# Patient Record
Sex: Female | Born: 1990 | Hispanic: Yes | Marital: Married | Smoking: Never smoker
Health system: Southern US, Community
[De-identification: ages and names within clinical notes are randomized; demographics above are authoritative.]

## PROBLEM LIST (undated history)

## (undated) DIAGNOSIS — O368390 Maternal care for abnormalities of the fetal heart rate or rhythm, unspecified trimester, not applicable or unspecified: Secondary | ICD-10-CM

## (undated) DIAGNOSIS — N920 Excessive and frequent menstruation with regular cycle: Secondary | ICD-10-CM

## (undated) DIAGNOSIS — N390 Urinary tract infection, site not specified: Secondary | ICD-10-CM

## (undated) DIAGNOSIS — J45909 Unspecified asthma, uncomplicated: Secondary | ICD-10-CM

## (undated) DIAGNOSIS — N915 Oligomenorrhea, unspecified: Secondary | ICD-10-CM

## (undated) HISTORY — DX: Unspecified asthma, uncomplicated: J45.909

## (undated) HISTORY — DX: Excessive and frequent menstruation with regular cycle: N92.0

## (undated) HISTORY — DX: Oligomenorrhea, unspecified: N91.5

## (undated) HISTORY — DX: Maternal care for abnormalities of the fetal heart rate or rhythm, unspecified trimester, not applicable or unspecified: O36.8390

## (undated) HISTORY — DX: Urinary tract infection, site not specified: N39.0

---

## 2010-04-22 ENCOUNTER — Ambulatory Visit: Payer: Self-pay | Admitting: Pediatrics

## 2011-01-17 HISTORY — PX: WISDOM TOOTH EXTRACTION: SHX21

## 2011-03-08 ENCOUNTER — Encounter: Payer: Self-pay | Admitting: Family Medicine

## 2013-04-10 ENCOUNTER — Ambulatory Visit (INDEPENDENT_AMBULATORY_CARE_PROVIDER_SITE_OTHER): Payer: 59 | Admitting: Physician Assistant

## 2013-04-10 ENCOUNTER — Encounter: Payer: Self-pay | Admitting: Physician Assistant

## 2013-04-10 VITALS — BP 114/76 | HR 72 | Temp 98.8°F | Resp 18 | Ht 63.25 in | Wt 171.0 lb

## 2013-04-10 DIAGNOSIS — N644 Mastodynia: Secondary | ICD-10-CM

## 2013-04-10 NOTE — Progress Notes (Signed)
   Patient ID: Jocelyn Griffin MRN: [id number], DOB: May 31, 1991, 21 y.o. Date of Encounter: 04/10/2013, 4:01 PM    Chief Complaint:  Chief Complaint  Patient presents with  . c/o left breast pain x sev weeks     HPI: 22 y.o. year old female reports that she has noticed area of tenderness in left breast for past week. States that in past has felt some tenderness prior to and during menses. This concerned her b/c she is not premenstrual and not on menses.  She drinks some coffee some days but not every day. Eats chocolate but again, not every day. No other high caffeine consumption.  No family h/o breast cancer. She has done no overhead activity/upper body exercise to casue pain in chest. Has had no trauma/injury to area.   Home Meds: See attached medication section for any medications that were entered at today's visit. The computer does not put those onto this list.The following list is a list of meds entered prior to today's visit.   No current outpatient prescriptions on file prior to visit.   No current facility-administered medications on file prior to visit.    Allergies: No Known Allergies    Review of Systems: See HPI for pertinent ROS. All other ROS negative.    Physical Exam: Blood pressure 114/76, pulse 72, temperature 98.8 F (37.1 C), temperature source Oral, resp. rate 18, height 5' 3.25" (1.607 m), weight 171 lb (77.565 kg), last menstrual period 04/01/2013., Body mass index is 30.04 kg/(m^2). General:  Appears in no acute distress Neck: Supple. No thyromegaly. No lymphadenopathy. Breasts: Both breast have a lot of fibrocystic changes. There are areas of hyper dense tissue, especially at inferior portion of right breast.  The area of tenderness is on left breast at 10 o'clock position. I feel no suspicious masses at this site. There are no skin changes present. There is no ecchymosis. Lungs: Clear bilaterally to auscultation without wheezes, rales, or rhonchi.  Breathing is unlabored. Heart: Regular rhythm. No murmurs, rubs, or gallops. Msk:  Strength and tone normal for age. Extremities/Skin: Warm and dry. No clubbing or cyanosis. No edema.  Neuro: Alert and oriented X 3. Moves all extremities spontaneously. Gait is normal. CNII-XII grossly in tact. Psych:  Responds to questions appropriately with a normal affect.     ASSESSMENT AND PLAN:  22 y.o. year old female with  1. Pain of left breast Reassured pt that breast cancer usually doesnot cause pain/tenderness.Discussed fibrocystic breasts. Discussed limiting caffeine intake.  She does want to proceed with imaging for further reassurance and evaluation.  - US Breast Bilateral; Future - MM Digital Screening; Future   Signed, Shon Hale [place], [place], [facility] 04/10/2013 4:01 PM

## 2013-04-11 ENCOUNTER — Encounter: Payer: Self-pay | Admitting: Internal Medicine

## 2013-04-15 ENCOUNTER — Ambulatory Visit
Admission: RE | Admit: 2013-04-15 | Discharge: 2013-04-15 | Disposition: A | Discharge status: Home / Self Care | Payer: 59 | Source: Ambulatory Visit | Attending: Physician Assistant | Admitting: Physician Assistant

## 2013-04-15 DIAGNOSIS — N644 Mastodynia: Secondary | ICD-10-CM

## 2013-11-18 ENCOUNTER — Ambulatory Visit (INDEPENDENT_AMBULATORY_CARE_PROVIDER_SITE_OTHER): Payer: 59 | Admitting: Nurse Practitioner

## 2013-11-18 ENCOUNTER — Encounter: Payer: Self-pay | Admitting: Nurse Practitioner

## 2013-11-18 VITALS — BP 100/64 | HR 60 | Ht 64.0 in | Wt 171.0 lb

## 2013-11-18 DIAGNOSIS — Z309 Encounter for contraceptive management, unspecified: Secondary | ICD-10-CM

## 2013-11-18 DIAGNOSIS — Z0000 Encounter for general adult medical examination without abnormal findings: Secondary | ICD-10-CM

## 2013-11-18 DIAGNOSIS — N915 Oligomenorrhea, unspecified: Secondary | ICD-10-CM

## 2013-11-18 DIAGNOSIS — IMO0001 Reserved for inherently not codable concepts without codable children: Secondary | ICD-10-CM

## 2013-11-18 DIAGNOSIS — Z01419 Encounter for gynecological examination (general) (routine) without abnormal findings: Secondary | ICD-10-CM

## 2013-11-18 DIAGNOSIS — R319 Hematuria, unspecified: Secondary | ICD-10-CM

## 2013-11-18 LAB — POCT URINALYSIS DIPSTICK
BILIRUBIN UA: NEGATIVE
Glucose, UA: NEGATIVE
KETONES UA: NEGATIVE
LEUKOCYTES UA: NEGATIVE
NITRITE UA: NEGATIVE
PH UA: 5
Protein, UA: NEGATIVE
Urobilinogen, UA: NEGATIVE

## 2013-11-18 LAB — HEMOGLOBIN, FINGERSTICK: HEMOGLOBIN, FINGERSTICK: 14.8 g/dL (ref 12.0–16.0)

## 2013-11-18 MED ORDER — PHENAZOPYRIDINE HCL 200 MG PO TABS: 200.0000 mg | ORAL_TABLET | Freq: Three times a day (TID) | ORAL | Status: DC | PRN

## 2013-11-18 MED ORDER — NITROFURANTOIN MONOHYD MACRO 100 MG PO CAPS: 100.0000 mg | ORAL_CAPSULE | ORAL | Status: DC | PRN

## 2013-11-18 MED ORDER — NORETHIN ACE-ETH ESTRAD-FE 1-20 MG-MCG PO TABS: 1.0000 | ORAL_TABLET | Freq: Every day | ORAL | Status: DC

## 2013-11-18 NOTE — Progress Notes (Signed)
Patient ID: Jocelyn Griffin, female   DOB: [birth year], 23 y.o.   MRN: [id number] 23 y.o. G0P0 Married Hispanic Fe here for annual exam.  Has been on OCP X 2 year. She is on an OCP from [place] that is similar to [place]  with a regular menses monthly.  Menses lasting 5 days but is still heavy X 2 days.   Then moderate to light.  Super tampon and pad changing every 2-3 hours.  Before OCP very heavy 3-4 days with cycle lasting 7 days.  Also irregular at 40 - 60 days.  No history of PCOS. Painful SA since January secondary to dryness and also having urinary pain, spasm or UTI post coital.    Married X 2 years.  Frequent UTI  with last infection in January and treated with Cipro 500 mg.  She also is having problems with weight gain despite going to the gym often and being very careful with diet.  She has a strong FMH of DM but unsure if PCOS.  Patient's last menstrual period was 10/21/2013.          Sexually active: yes  The current method of family planning is OCP (estrogen/progesterone).    Exercising: yes  Gym/ health club routine includes cardio, high impact aerobics  and light weights. Smoker:  no  Health Maintenance: Pap:  08/28/12 normal MMG:  04/10/13, breast ultrasound, normal TDaP:  2010 Labs: HB:  14.8  Urine:  Trace RBC, pH 5.0   reports that she has never smoked. She has never used smokeless tobacco. She reports that she does not drink alcohol or use illicit drugs.  History reviewed. No pertinent past medical history.  Past Surgical History  Procedure Laterality Date  . Wisdom tooth extraction  01/17/11    Current Outpatient Prescriptions  Medication Sig Dispense Refill  . levonorgestrel-ethinyl estradiol (SEASONALE,INTROVALE,JOLESSA) 0.15-0.03 MG tablet Take 1 tablet by mouth daily. MICROGYNON ED Fe per box label      . nitrofurantoin, macrocrystal-monohydrate, (MACROBID) 100 MG capsule Take 1 capsule (100 mg total) by mouth as needed. Take 1 tablet PC  30 capsule  2  .  norethindrone-ethinyl estradiol (JUNEL FE,GILDESS FE,LOESTRIN FE) 1-20 MG-MCG tablet Take 1 tablet by mouth daily.  3 Package  1  . phenazopyridine (PYRIDIUM) 200 MG tablet Take 1 tablet (200 mg total) by mouth 3 (three) times daily as needed for pain.  30 tablet  2   No current facility-administered medications for this visit.    Family History  Problem Relation Age of Onset  . Hypertension Father   . Diabetes Maternal Aunt   . Diabetes Maternal Grandmother   . Hypertension Maternal Grandmother   . Colon cancer Maternal Grandmother [id number]  . Cervical cancer Paternal Grandmother [id number]    ROS:  Pertinent items are noted in HPI.  Otherwise, a comprehensive ROS was negative.  Exam:   BP 100/64  Pulse 60  Ht 5\' 4"  (1.626 m)  Wt 171 lb (77.565 kg)  BMI 29.34 kg/m2  LMP 10/21/2013 Height: 5\' 4"  (162.6 cm)  Ht Readings from Last 3 Encounters:  11/18/13 5\' 4"  (1.626 m)  04/10/13 5' 3.25" (1.607 m)    General appearance: alert, cooperative and appears stated age Head: Normocephalic, without obvious abnormality, atraumatic Neck: no adenopathy, supple, symmetrical, trachea midline and thyroid normal to inspection and palpation Lungs: clear to auscultation bilaterally Breasts: normal appearance, no masses or tenderness Heart: regular rate and rhythm Abdomen: soft, non-tender; no masses,  no organomegaly Extremities:  extremities normal, atraumatic, no cyanosis or edema Skin: Skin color, texture, turgor normal. No rashes or lesions Lymph nodes: Cervical, supraclavicular, and axillary nodes normal. No abnormal inguinal nodes palpated Neurologic: Grossly normal   Pelvic: External genitalia:  no lesions              Urethra:  normal appearing urethra with no masses, tenderness or lesions              Bartholin's and Skene's: normal                 Vagina: normal appearing vagina with normal color and discharge, no lesions              Cervix: anteverted              Pap taken: no Bimanual  Exam:  Uterus:  normal size, contour, position, consistency, mobility, non-tender              Adnexa: no mass, fullness, tenderness               Rectovaginal: Confirms               Anus:  normal sphincter tone, no lesions  A:  Well Woman with normal exam  Contraception    History of menorrhagia and irregular menses  R/O PCOS  History of UTI - post coital - R/O UTI  FMH: obesity, DM, HTN    P:   Pap smear as per guidelines   Will change OCP to Loestrin 1/20 X 3 months to see if menorrhagia has improved. Plan for a consult at that time and discuss.  She will return for fasting labs and will follow  Information is given about PCOS  Will follow with urine culture.  She is given Macrobid to use PC.   Also refill on Pyridium 200 mg prn  Discussed use of vaginal lubrication prn with SA  Counseled on breast self exam, use and side effects of OCP's, adequate intake of calcium and vitamin D, diet and exercise return annually or prn  Mother with patient today but not present during interview  An After Visit Summary was printed and given to the patient.

## 2013-11-18 NOTE — Patient Instructions (Addendum)
General topics  Next pap or exam is  due in 1 year Take a Women's multivitamin Take 1200 mg. of calcium daily - prefer dietary If any concerns in interim to call back  Breast Self-Awareness Practicing breast self-awareness may pick up problems early, prevent significant medical complications, and possibly save your life. By practicing breast self-awareness, you can become familiar with how your breasts look and feel and if your breasts are changing. This allows you to notice changes early. It can also offer you some reassurance that your breast health is good. One way to learn what is normal for your breasts and whether your breasts are changing is to do a breast self-exam. If you find a lump or something that was not present in the past, it is best to contact your caregiver right away. Other findings that should be evaluated by your caregiver include nipple discharge, especially if it is bloody; skin changes or reddening; areas where the skin seems to be pulled in (retracted); or new lumps and bumps. Breast pain is seldom associated with cancer (malignancy), but should also be evaluated by a caregiver. BREAST SELF-EXAM The best time to examine your breasts is 5 7 days after your menstrual period is over.  ExitCare Patient Information 2013 [employer].   Exercise to Stay Healthy Exercise helps you become and stay healthy. EXERCISE IDEAS AND TIPS Choose exercises that:  You enjoy.  Fit into your day. You do not need to exercise really hard to be healthy. You can do exercises at a slow or medium level and stay healthy. You can:  Stretch before and after working out.  Try yoga, Pilates, or tai chi.  Lift weights.  Walk fast, swim, jog, run, climb stairs, bicycle, dance, or rollerskate.  Take aerobic classes. Exercises that burn about 150 calories:  Running 1  miles in 15 minutes.  Playing volleyball for 45 to 60 minutes.  Washing and waxing a car for 45 to 60  minutes.  Playing touch football for 45 minutes.  Walking 1  miles in 35 minutes.  Pushing a stroller 1  miles in 30 minutes.  Playing basketball for 30 minutes.  Raking leaves for 30 minutes.  Bicycling 5 miles in 30 minutes.  Walking 2 miles in 30 minutes.  Dancing for 30 minutes.  Shoveling snow for 15 minutes.  Swimming laps for 20 minutes.  Walking up stairs for 15 minutes.  Bicycling 4 miles in 15 minutes.  Gardening for 30 to 45 minutes.  Jumping rope for 15 minutes.  Washing windows or floors for 45 to 60 minutes. Document Released: 10/07/2010 Document Revised: 11/27/2011 Document Reviewed: 10/07/2010 [employer] Patient Information 2013 [employer].   Other topics ( that may be useful information):    Sexually Transmitted Disease Sexually transmitted disease (STD) refers to any infection that is passed from person to person during sexual activity. This may happen by way of saliva, semen, blood, vaginal mucus, or urine. Common STDs include:  Gonorrhea.  Chlamydia.  Syphilis.  HIV/AIDS.  Genital herpes.  Hepatitis B and C.  Trichomonas.  Human papillomavirus (HPV).  Pubic lice. CAUSES  An STD may be spread by bacteria, virus, or parasite. A person can get an STD by:  Sexual intercourse with an infected person.  Sharing sex toys with an infected person.  Sharing needles with an infected person.  Having intimate contact with the genitals, mouth, or rectal areas of an infected person. SYMPTOMS  Some people may not have any symptoms, but   they can still pass the infection to others. Different STDs have different symptoms. Symptoms include:  Painful or bloody urination.  Pain in the pelvis, abdomen, vagina, anus, throat, or eyes.  Skin rash, itching, irritation, growths, or sores (lesions). These usually occur in the genital or anal area.  Abnormal vaginal discharge.  Penile discharge in men.  Soft, flesh-colored skin growths in the  genital or anal area.  Fever.  Pain or bleeding during sexual intercourse.  Swollen glands in the groin area.  Yellow skin and eyes (jaundice). This is seen with hepatitis. DIAGNOSIS  To make a diagnosis, your caregiver may:  Take a medical history.  Perform a physical exam.  Take a specimen (culture) to be examined.  Examine a sample of discharge under a microscope.  Perform blood test TREATMENT   Chlamydia, gonorrhea, trichomonas, and syphilis can be cured with antibiotic medicine.  Genital herpes, hepatitis, and HIV can be treated, but not cured, with prescribed medicines. The medicines will lessen the symptoms.  Genital warts from HPV can be treated with medicine or by freezing, burning (electrocautery), or surgery. Warts may come back.  HPV is a virus and cannot be cured with medicine or surgery.However, abnormal areas may be followed very closely by your caregiver and may be removed from the cervix, vagina, or vulva through office procedures or surgery. If your diagnosis is confirmed, your recent sexual partners need treatment. This is true even if they are symptom-free or have a negative culture or evaluation. They should not have sex until their caregiver says it is okay. HOME CARE INSTRUCTIONS  All sexual partners should be informed, tested, and treated for all STDs.  Take your antibiotics as directed. Finish them even if you start to feel better.  Only take over-the-counter or prescription medicines for pain, discomfort, or fever as directed by your caregiver.  Rest.  Eat a balanced diet and drink enough fluids to keep your urine clear or pale yellow.  Do not have sex until treatment is completed and you have followed up with your caregiver. STDs should be checked after treatment.  Keep all follow-up appointments, Pap tests, and blood tests as directed by your caregiver.  Only use latex condoms and water-soluble lubricants during sexual activity. Do not use  petroleum jelly or oils.  Avoid alcohol and illegal drugs.  Get vaccinated for HPV and hepatitis. If you have not received these vaccines in the past, talk to your caregiver about whether one or both might be right for you.  Avoid risky sex practices that can break the skin. The only way to avoid getting an STD is to avoid all sexual activity.Latex condoms and dental dams (for oral sex) will help lessen the risk of getting an STD, but will not completely eliminate the risk. SEEK MEDICAL CARE IF:   You have a fever.  You have any new or worsening symptoms. Document Released: 11/25/2002 Document Revised: 11/27/2011 Document Reviewed: 12/02/2010 [facility] Patient Information 2013 [employer].    Domestic Abuse You are being battered or abused if someone close to you hits, pushes, or physically hurts you in any way. You also are being abused if you are forced into activities. You are being sexually abused if you are forced to have sexual contact of any kind. You are being emotionally abused if you are made to feel worthless or if you are constantly threatened. It is important to remember that help is available. No one has the right to abuse you. PREVENTION OF FURTHER  ABUSE  Learn the warning signs of danger. This varies with situations but may include: the use of alcohol, threats, isolation from friends and family, or forced sexual contact. Leave if you feel that violence is going to occur.  If you are attacked or beaten, report it to the police so the abuse is documented. You do not have to press charges. The police can protect you while you or the attackers are leaving. Get the officer's name and badge number and a copy of the report.  Find someone you can trust and tell them what is happening to you: your caregiver, a nurse, clergy member, close friend or family member. Feeling ashamed is natural, but remember that you have done nothing wrong. No one deserves abuse. Document Released:  09/01/2000 Document Revised: 11/27/2011 Document Reviewed: 11/10/2010 [employer] Patient Information 2013 [employer].    How Much is Too Much Alcohol? Drinking too much alcohol can cause injury, accidents, and health problems. These types of problems can include:   Car crashes.  Falls.  Family fighting (domestic violence).  Drowning.  Fights.  Injuries.  Burns.  Damage to certain organs.  Having a baby with birth defects. ONE DRINK CAN BE TOO MUCH WHEN YOU ARE:  Working.  Pregnant or breastfeeding.  Taking medicines. Ask your doctor.  Driving or planning to drive. If you or someone you know has a drinking problem, get help from a doctor.  Document Released: 07/01/2009 Document Revised: 11/27/2011 Document Reviewed: 07/01/2009 [employer] Patient Information 2013 [employer].   Smoking Hazards Smoking cigarettes is extremely bad for your health. Tobacco smoke has over 200 known poisons in it. There are over 60 chemicals in tobacco smoke that cause cancer. Some of the chemicals found in cigarette smoke include:   Cyanide.  Benzene.  Formaldehyde.  Methanol (wood alcohol).  Acetylene (fuel used in welding torches).  Ammonia. Cigarette smoke also contains the poisonous gases nitrogen oxide and carbon monoxide.  Cigarette smokers have an increased risk of many serious medical problems and Smoking causes approximately:  90% of all lung cancer deaths in men.  80% of all lung cancer deaths in women.  90% of deaths from chronic obstructive lung disease. Compared with nonsmokers, smoking increases the risk of:  Coronary heart disease by 2 to 4 times.  Stroke by 2 to 4 times.  Men developing lung cancer by 23 times.  Women developing lung cancer by 13 times.  Dying from chronic obstructive lung diseases by 12 times.  . Smoking is the most preventable cause of death and disease in our society.  WHY IS SMOKING ADDICTIVE?  Nicotine is the chemical  agent in tobacco that is capable of causing addiction or dependence.  When you smoke and inhale, nicotine is absorbed rapidly into the bloodstream through your lungs. Nicotine absorbed through the lungs is capable of creating a powerful addiction. Both inhaled and non-inhaled nicotine may be addictive.  Addiction studies of cigarettes and spit tobacco show that addiction to nicotine occurs mainly during the teen years, when young people begin using tobacco products. WHAT ARE THE BENEFITS OF QUITTING?  There are many health benefits to quitting smoking.   Likelihood of developing cancer and heart disease decreases. Health improvements are seen almost immediately.  Blood pressure, pulse rate, and breathing patterns start returning to normal soon after quitting. QUITTING SMOKING   American Lung Association - 1-800-LUNGUSA  American Cancer Society - 1-800-ACS-2345 Document Released: 10/12/2004 Document Revised: 11/27/2011 Document Reviewed: 06/16/2009 Exit[employer] Patient Information 2013 [employer].   Stress Management Stress is a state of physical or mental tension that often results from changes in your life or normal routine. Some common causes of stress are:  Death of a loved one.  Injuries or severe illnesses.  Getting fired or changing jobs.  Moving into a new home. Other causes may be:  Sexual problems.  Business or financial losses.  Taking on a large debt.  Regular conflict with someone at home or at work.  Constant tiredness from lack of sleep. It is not just bad things that are stressful. It may be stressful to:  Win the lottery.  Get married.  Buy a new car. The amount of stress that can be easily tolerated varies from person to person. Changes generally cause stress, regardless of the types of change. Too much stress can affect your health. It may lead to physical or emotional problems. Too little stress (boredom) may also become stressful. SUGGESTIONS TO  REDUCE STRESS:  Talk things over with your family and friends. It often is helpful to share your concerns and worries. If you feel your problem is serious, you may want to get help from a professional counselor.  Consider your problems one at a time instead of lumping them all together. Trying to take care of everything at once may seem impossible. List all the things you need to do and then start with the most important one. Set a goal to accomplish 2 or 3 things each day. If you expect to do too many in a single day you will naturally fail, causing you to feel even more stressed.  Do not use alcohol or drugs to relieve stress. Although you may feel better for a short time, they do not remove the problems that caused the stress. They can also be habit forming.  Exercise regularly - at least 3 times per week. Physical exercise can help to relieve that "uptight" feeling and will relax you.  The shortest distance between despair and hope is often a good night's sleep.  Go to bed and get up on time allowing yourself time for appointments without being rushed.  Take a short "time-out" period from any stressful situation that occurs during the day. Close your eyes and take some deep breaths. Starting with the muscles in your face, tense them, hold it for a few seconds, then relax. Repeat this with the muscles in your neck, shoulders, hand, stomach, back and legs.  Take good care of yourself. Eat a balanced diet and get plenty of rest.  Schedule time for having fun. Take a break from your daily routine to relax. HOME CARE INSTRUCTIONS   Call if you feel overwhelmed by your problems and feel you can no longer manage them on your own.  Return immediately if you feel like hurting yourself or someone else. Document Released: 02/28/2001 Document Revised: 11/27/2011 Document Reviewed: 10/21/2007 Exit[employer] Patient Information 2013 [employer].       General topics  Next pap or exam is  due in 1  year Take a Women's multivitamin Take 1200 mg. of calcium daily - prefer dietary If any concerns in interim to call back  Breast Self-Awareness Practicing breast self-awareness may pick up problems early, prevent significant medical complications, and possibly save your life. By practicing breast self-awareness, you can become familiar with how your breasts look and feel and if your breasts are changing. This allows you to notice changes early. It can also offer you some reassurance that your breast   health is good. One way to learn what is normal for your breasts and whether your breasts are changing is to do a breast self-exam. If you find a lump or something that was not present in the past, it is best to contact your caregiver right away. Other findings that should be evaluated by your caregiver include nipple discharge, especially if it is bloody; skin changes or reddening; areas where the skin seems to be pulled in (retracted); or new lumps and bumps. Breast pain is seldom associated with cancer (malignancy), but should also be evaluated by a caregiver. BREAST SELF-EXAM The best time to examine your breasts is 5 7 days after your menstrual period is over.  Exit[employer] Patient Information 2013 [employer].   Exercise to Stay Healthy Exercise helps you become and stay healthy. EXERCISE IDEAS AND TIPS Choose exercises that:  You enjoy.  Fit into your day. You do not need to exercise really hard to be healthy. You can do exercises at a slow or medium level and stay healthy. You can:  Stretch before and after working out.  Try yoga, Pilates, or tai chi.  Lift weights.  Walk fast, swim, jog, run, climb stairs, bicycle, dance, or rollerskate.  Take aerobic classes. Exercises that burn about 150 calories:  Running 1  miles in 15 minutes.  Playing volleyball for 45 to 60 minutes.  Washing and waxing a car for 45 to 60 minutes.  Playing touch football for 45 minutes.  Walking 1   miles in 35 minutes.  Pushing a stroller 1  miles in 30 minutes.  Playing basketball for 30 minutes.  Raking leaves for 30 minutes.  Bicycling 5 miles in 30 minutes.  Walking 2 miles in 30 minutes.  Dancing for 30 minutes.  Shoveling snow for 15 minutes.  Swimming laps for 20 minutes.  Walking up stairs for 15 minutes.  Bicycling 4 miles in 15 minutes.  Gardening for 30 to 45 minutes.  Jumping rope for 15 minutes.  Washing windows or floors for 45 to 60 minutes. Document Released: 10/07/2010 Document Revised: 11/27/2011 Document Reviewed: 10/07/2010 [employer] Patient Information 2013 [employer].   Other topics ( that may be useful information):    Sexually Transmitted Disease Sexually transmitted disease (STD) refers to any infection that is passed from person to person during sexual activity. This may happen by way of saliva, semen, blood, vaginal mucus, or urine. Common STDs include:  Gonorrhea.  Chlamydia.  Syphilis.  HIV/AIDS.  Genital herpes.  Hepatitis B and C.  Trichomonas.  Human papillomavirus (HPV).  Pubic lice. CAUSES  An STD may be spread by bacteria, virus, or parasite. A person can get an STD by:  Sexual intercourse with an infected person.  Sharing sex toys with an infected person.  Sharing needles with an infected person.  Having intimate contact with the genitals, mouth, or rectal areas of an infected person. SYMPTOMS  Some people may not have any symptoms, but they can still pass the infection to others. Different STDs have different symptoms. Symptoms include:  Painful or bloody urination.  Pain in the pelvis, abdomen, vagina, anus, throat, or eyes.  Skin rash, itching, irritation, growths, or sores (lesions). These usually occur in the genital or anal area.  Abnormal vaginal discharge.  Penile discharge in men.  Soft, flesh-colored skin growths in the genital or anal area.  Fever.  Pain or bleeding during  sexual intercourse.  Swollen glands in the groin area.  Yellow skin and eyes (jaundice).   This is seen with hepatitis. DIAGNOSIS  To make a diagnosis, your caregiver may:  Take a medical history.  Perform a physical exam.  Take a specimen (culture) to be examined.  Examine a sample of discharge under a microscope.  Perform blood test TREATMENT   Chlamydia, gonorrhea, trichomonas, and syphilis can be cured with antibiotic medicine.  Genital herpes, hepatitis, and HIV can be treated, but not cured, with prescribed medicines. The medicines will lessen the symptoms.  Genital warts from HPV can be treated with medicine or by freezing, burning (electrocautery), or surgery. Warts may come back.  HPV is a virus and cannot be cured with medicine or surgery.However, abnormal areas may be followed very closely by your caregiver and may be removed from the cervix, vagina, or vulva through office procedures or surgery. If your diagnosis is confirmed, your recent sexual partners need treatment. This is true even if they are symptom-free or have a negative culture or evaluation. They should not have sex until their caregiver says it is okay. HOME CARE INSTRUCTIONS  All sexual partners should be informed, tested, and treated for all STDs.  Take your antibiotics as directed. Finish them even if you start to feel better.  Only take over-the-counter or prescription medicines for pain, discomfort, or fever as directed by your caregiver.  Rest.  Eat a balanced diet and drink enough fluids to keep your urine clear or pale yellow.  Do not have sex until treatment is completed and you have followed up with your caregiver. STDs should be checked after treatment.  Keep all follow-up appointments, Pap tests, and blood tests as directed by your caregiver.  Only use latex condoms and water-soluble lubricants during sexual activity. Do not use petroleum jelly or oils.  Avoid alcohol and illegal  drugs.  Get vaccinated for HPV and hepatitis. If you have not received these vaccines in the past, talk to your caregiver about whether one or both might be right for you.  Avoid risky sex practices that can break the skin. The only way to avoid getting an STD is to avoid all sexual activity.Latex condoms and dental dams (for oral sex) will help lessen the risk of getting an STD, but will not completely eliminate the risk. SEEK MEDICAL CARE IF:   You have a fever.  You have any new or worsening symptoms. Document Released: 11/25/2002 Document Revised: 11/27/2011 Document Reviewed: 12/02/2010 [employer] Patient Information 2013 [employer].    Domestic Abuse You are being battered or abused if someone close to you hits, pushes, or physically hurts you in any way. You also are being abused if you are forced into activities. You are being sexually abused if you are forced to have sexual contact of any kind. You are being emotionally abused if you are made to feel worthless or if you are constantly threatened. It is important to remember that help is available. No one has the right to abuse you. PREVENTION OF FURTHER ABUSE  Learn the warning signs of danger. This varies with situations but may include: the use of alcohol, threats, isolation from friends and family, or forced sexual contact. Leave if you feel that violence is going to occur.  If you are attacked or beaten, report it to the police so the abuse is documented. You do not have to press charges. The police can protect you while you or the attackers are leaving. Get the officer's name and badge number and a copy of the report.  Find someone you   can trust and tell them what is happening to you: your caregiver, a nurse, clergy member, close friend or family member. Feeling ashamed is natural, but remember that you have done nothing wrong. No one deserves abuse. Document Released: 09/01/2000 Document Revised: 11/27/2011 Document  Reviewed: 11/10/2010 [employer] Patient Information 2013 [employer].    How Much is Too Much Alcohol? Drinking too much alcohol can cause injury, accidents, and health problems. These types of problems can include:   Car crashes.  Falls.  Family fighting (domestic violence).  Drowning.  Fights.  Injuries.  Burns.  Damage to certain organs.  Having a baby with birth defects. ONE DRINK CAN BE TOO MUCH WHEN YOU ARE:  Working.  Pregnant or breastfeeding.  Taking medicines. Ask your doctor.  Driving or planning to drive. If you or someone you know has a drinking problem, get help from a doctor.  Document Released: 07/01/2009 Document Revised: 11/27/2011 Document Reviewed: 07/01/2009 [employer] Patient Information 2013 [employer].   Smoking Hazards Smoking cigarettes is extremely bad for your health. Tobacco smoke has over 200 known poisons in it. There are over 60 chemicals in tobacco smoke that cause cancer. Some of the chemicals found in cigarette smoke include:   Cyanide.  Benzene.  Formaldehyde.  Methanol (wood alcohol).  Acetylene (fuel used in welding torches).  Ammonia. Cigarette smoke also contains the poisonous gases nitrogen oxide and carbon monoxide.  Cigarette smokers have an increased risk of many serious medical problems and Smoking causes approximately:  90% of all lung cancer deaths in men.  80% of all lung cancer deaths in women.  90% of deaths from chronic obstructive lung disease. Compared with nonsmokers, smoking increases the risk of:  Coronary heart disease by 2 to 4 times.  Stroke by 2 to 4 times.  Men developing lung cancer by 23 times.  Women developing lung cancer by 13 times.  Dying from chronic obstructive lung diseases by 12 times.  . Smoking is the most preventable cause of death and disease in our society.  WHY IS SMOKING ADDICTIVE?  Nicotine is the chemical agent in tobacco that is capable of causing addiction  or dependence.  When you smoke and inhale, nicotine is absorbed rapidly into the bloodstream through your lungs. Nicotine absorbed through the lungs is capable of creating a powerful addiction. Both inhaled and non-inhaled nicotine may be addictive.  Addiction studies of cigarettes and spit tobacco show that addiction to nicotine occurs mainly during the teen years, when young people begin using tobacco products. WHAT ARE THE BENEFITS OF QUITTING?  There are many health benefits to quitting smoking.   Likelihood of developing cancer and heart disease decreases. Health improvements are seen almost immediately.  Blood pressure, pulse rate, and breathing patterns start returning to normal soon after quitting. QUITTING SMOKING   American Lung Association - 1-800-LUNGUSA  American Cancer Society - 1-800-ACS-2345 Document Released: 10/12/2004 Document Revised: 11/27/2011 Document Reviewed: 06/16/2009 [employer] Patient Information 2013 [employer].   Stress Management Stress is a state of physical or mental tension that often results from changes in your life or normal routine. Some common causes of stress are:  Death of a loved one.  Injuries or severe illnesses.  Getting fired or changing jobs.  Moving into a new home. Other causes may be:  Sexual problems.  Business or financial losses.  Taking on a large debt.  Regular conflict with someone at home or at work.  Constant tiredness from lack of sleep. It is not   just bad things that are stressful. It may be stressful to:  Win the lottery.  Get married.  Buy a new car. The amount of stress that can be easily tolerated varies from person to person. Changes generally cause stress, regardless of the types of change. Too much stress can affect your health. It may lead to physical or emotional problems. Too little stress (boredom) may also become stressful. SUGGESTIONS TO REDUCE STRESS:  Talk things over with your family and  friends. It often is helpful to share your concerns and worries. If you feel your problem is serious, you may want to get help from a professional counselor.  Consider your problems one at a time instead of lumping them all together. Trying to take care of everything at once may seem impossible. List all the things you need to do and then start with the most important one. Set a goal to accomplish 2 or 3 things each day. If you expect to do too many in a single day you will naturally fail, causing you to feel even more stressed.  Do not use alcohol or drugs to relieve stress. Although you may feel better for a short time, they do not remove the problems that caused the stress. They can also be habit forming.  Exercise regularly - at least 3 times per week. Physical exercise can help to relieve that "uptight" feeling and will relax you.  The shortest distance between despair and hope is often a good night's sleep.  Go to bed and get up on time allowing yourself time for appointments without being rushed.  Take a short "time-out" period from any stressful situation that occurs during the day. Close your eyes and take some deep breaths. Starting with the muscles in your face, tense them, hold it for a few seconds, then relax. Repeat this with the muscles in your neck, shoulders, hand, stomach, back and legs.  Take good care of yourself. Eat a balanced diet and get plenty of rest.  Schedule time for having fun. Take a break from your daily routine to relax. HOME CARE INSTRUCTIONS   Call if you feel overwhelmed by your problems and feel you can no longer manage them on your own.  Return immediately if you feel like hurting yourself or someone else. Document Released: 02/28/2001 Document Revised: 11/27/2011 Document Reviewed: 10/21/2007 ExitCare Patient Information 2013 [employer].   

## 2013-11-19 LAB — URINALYSIS, MICROSCOPIC ONLY
Bacteria, UA: NONE SEEN
Casts: NONE SEEN
Crystals: NONE SEEN
SQUAMOUS EPITHELIAL / LPF: NONE SEEN

## 2013-11-19 LAB — URINE CULTURE
Colony Count: NO GROWTH
ORGANISM ID, BACTERIA: NO GROWTH

## 2013-11-19 NOTE — Progress Notes (Signed)
Encounter reviewed by Dr. Brook Silva.  

## 2013-11-20 ENCOUNTER — Other Ambulatory Visit (INDEPENDENT_AMBULATORY_CARE_PROVIDER_SITE_OTHER): Payer: 59

## 2013-11-20 DIAGNOSIS — N915 Oligomenorrhea, unspecified: Secondary | ICD-10-CM

## 2013-11-20 LAB — HEMOGLOBIN A1C
Hgb A1c MFr Bld: 5.1 % (ref <5.7)
MEAN PLASMA GLUCOSE: 100 mg/dL (ref <117)

## 2013-11-21 ENCOUNTER — Telehealth: Payer: Self-pay | Admitting: *Deleted

## 2013-11-21 LAB — TESTOSTERONE: TESTOSTERONE: 52 ng/dL (ref 10–70)

## 2013-11-21 LAB — GLUCOSE, RANDOM: Glucose, Bld: 77 mg/dL (ref 70–99)

## 2013-11-21 LAB — TSH: TSH: 1.045 u[IU]/mL (ref 0.350–4.500)

## 2013-11-21 LAB — INSULIN, FASTING: Insulin fasting, serum: 18 u[IU]/mL (ref 3–28)

## 2013-11-21 NOTE — Telephone Encounter (Signed)
I have attempted to contact this patient by phone with the following results: left message to return my call on answering machine (home/mobile).  

## 2013-11-21 NOTE — Telephone Encounter (Signed)
Message copied by Luisa DagoPHILLIPS, Freja Faro C on Fri Nov 21, 2013 11:38 AM ------      Message from: Ria CommentGRUBB, PATRICIA R      Created: Fri Nov 21, 2013 10:00 AM       Good new let patient know that all fasting insulin, HGB A1C, glucose, thyroid, and testosterone level is normal ------

## 2013-11-25 ENCOUNTER — Other Ambulatory Visit: Payer: 59

## 2013-11-25 NOTE — Telephone Encounter (Signed)
Pt notified in result note on 11/21/13.

## 2013-12-07 ENCOUNTER — Encounter: Payer: Self-pay | Admitting: Nurse Practitioner

## 2013-12-08 ENCOUNTER — Encounter: Payer: Self-pay | Admitting: Nurse Practitioner

## 2013-12-08 ENCOUNTER — Ambulatory Visit (INDEPENDENT_AMBULATORY_CARE_PROVIDER_SITE_OTHER): Payer: 59 | Admitting: Nurse Practitioner

## 2013-12-08 ENCOUNTER — Telehealth: Payer: Self-pay | Admitting: Nurse Practitioner

## 2013-12-08 VITALS — BP 110/74 | HR 68 | Ht 64.0 in | Wt 173.0 lb

## 2013-12-08 DIAGNOSIS — B379 Candidiasis, unspecified: Secondary | ICD-10-CM

## 2013-12-08 MED ORDER — NYSTATIN-TRIAMCINOLONE 100000-0.1 UNIT/GM-% EX OINT: 1.0000 "application " | TOPICAL_OINTMENT | Freq: Two times a day (BID) | CUTANEOUS | Status: DC

## 2013-12-08 MED ORDER — FLUCONAZOLE 150 MG PO TABS: 150.0000 mg | ORAL_TABLET | Freq: Once | ORAL | Status: DC

## 2013-12-08 NOTE — Telephone Encounter (Signed)
Patient has appointment today with Jocelyn GoltzPatty Grubb at 11:15. Patient called in this morning and [name] scheduled.  Routing to provider for final review. Patient agreeable to disposition. Will close encounter

## 2013-12-08 NOTE — Patient Instructions (Signed)
Monilial Vaginitis  Vaginitis in a soreness, swelling and redness (inflammation) of the vagina and vulva. Monilial vaginitis is not a sexually transmitted infection.  CAUSES   Yeast vaginitis is caused by yeast (candida) that is normally found in your vagina. With a yeast infection, the candida has overgrown in number to a point that upsets the chemical balance.  SYMPTOMS   · White, thick vaginal discharge.  · Swelling, itching, redness and irritation of the vagina and possibly the lips of the vagina (vulva).  · Burning or painful urination.  · Painful intercourse.  DIAGNOSIS   Things that may contribute to monilial vaginitis are:  · Postmenopausal and virginal states.  · Pregnancy.  · Infections.  · Being tired, sick or stressed, especially if you had monilial vaginitis in the past.  · Diabetes. Good control will help lower the chance.  · Birth control pills.  · Tight fitting garments.  · Using bubble bath, feminine sprays, douches or deodorant tampons.  · Taking certain medications that kill germs (antibiotics).  · Sporadic recurrence can occur if you become ill.  TREATMENT   Your caregiver will give you medication.  · There are several kinds of anti monilial vaginal creams and suppositories specific for monilial vaginitis. For recurrent yeast infections, use a suppository or cream in the vagina 2 times a week, or as directed.  · Anti-monilial or steroid cream for the itching or irritation of the vulva may also be used. Get your caregiver's permission.  · Painting the vagina with methylene blue solution may help if the monilial cream does not work.  · Eating yogurt may help prevent monilial vaginitis.  HOME CARE INSTRUCTIONS   · Finish all medication as prescribed.  · Do not have sex until treatment is completed or after your caregiver tells you it is okay.  · Take warm sitz baths.  · Do not douche.  · Do not use tampons, especially scented ones.  · Wear cotton underwear.  · Avoid tight pants and panty  hose.  · Tell your sexual partner that you have a yeast infection. They should go to their caregiver if they have symptoms such as mild rash or itching.  · Your sexual partner should be treated as well if your infection is difficult to eliminate.  · Practice safer sex. Use condoms.  · Some vaginal medications cause latex condoms to fail. Vaginal medications that harm condoms are:  · Cleocin cream.  · Butoconazole (Femstat®).  · Terconazole (Terazol®) vaginal suppository.  · Miconazole (Monistat®) (may be purchased over the counter).  SEEK MEDICAL CARE IF:   · You have a temperature by mouth above 102° F (38.9° C).  · The infection is getting worse after 2 days of treatment.  · The infection is not getting better after 3 days of treatment.  · You develop blisters in or around your vagina.  · You develop vaginal bleeding, and it is not your menstrual period.  · You have pain when you urinate.  · You develop intestinal problems.  · You have pain with sexual intercourse.  Document Released: 06/14/2005 Document Revised: 11/27/2011 Document Reviewed: 02/26/2009  ExitCare® Patient Information ©2014 [employer].

## 2013-12-08 NOTE — Progress Notes (Signed)
Subjective:     Patient ID: Jocelyn Griffin, female   DOB: [birth year], 23 y.o.   MRN: [id number]  HPI   [address] G0P0 Married Jocelyn Griffin here for yeast vaginitis with a rash on inner thighs.  She had been on Cipro about a month ago. These symptoms started last week.  She had tried OTC Vagisil externally which made the itching worse.  She then tried Monistat 1 day tablet that also made he symptoms internally worse.   Has been on OCP X 2 year. She is getting ready to travel back home to [place] this Friday.  Denies any urinary symptoms.   Review of Systems  Constitutional: Negative for fever, chills and fatigue.  Respiratory: Negative.   Cardiovascular: Negative.   Gastrointestinal: Negative.   Genitourinary: Positive for dyspareunia. Negative for dysuria, frequency, flank pain, decreased urine volume, difficulty urinating, genital sores and pelvic pain.  Musculoskeletal: Negative.   Skin: Negative.   Neurological: Negative.   Psychiatric/Behavioral: Negative.        Objective:   Physical Exam  Constitutional: She appears well-developed and well-nourished. No distress.  Abdominal: Soft. She exhibits no distension. There is no tenderness. There is no rebound.  Genitourinary:  Very thick white cottage cheesy discharge with external irritation consistent with yeast. No other lesions.  Wet prep not done secondary to other OTC creams used.  Neurological: She is alert.  Psychiatric: She has a normal mood and affect. Her behavior is normal. Judgment and thought content normal.       Assessment:     Yeast vulva vaginitis    Plan:     Diflucan 150 mg X 2 and refill to take with her on the trip in case symptoms return RX for Triamcinolone and Nystatin to use BID prn for comfort Reviewed vaginal health care and not using scented soaps or detergents - however this episode may be secondary to antibiotic use.

## 2013-12-08 NOTE — Telephone Encounter (Signed)
Please call pt. Prob needs appt. MSM ----- Message ----- From: Vernie Murdersenise A Starace Sent: 12/07/2013 9:29 PM To: [facility] Pool Subject: Non-Urgent Medical Question Hello, This weekend I started to feel an itchy and burning sensation in my vaginal area, burning and pain during intercourse, white discharge. I went and bought Monistat 1(no intercourse after this med). It has helped but it still feels very itchy and now I started to get some bumps in my inner thighs that itch as well. I am leaving the country on Friday. What should I do? Thank you so much.

## 2013-12-09 NOTE — Progress Notes (Signed)
Encounter reviewed by Dr. Brook Silva.  

## 2014-02-17 ENCOUNTER — Ambulatory Visit (INDEPENDENT_AMBULATORY_CARE_PROVIDER_SITE_OTHER): Payer: 59 | Admitting: Nurse Practitioner

## 2014-02-17 ENCOUNTER — Encounter: Payer: Self-pay | Admitting: Nurse Practitioner

## 2014-02-17 VITALS — BP 110/66 | HR 60 | Ht 64.0 in | Wt 174.0 lb

## 2014-02-17 DIAGNOSIS — Z3041 Encounter for surveillance of contraceptive pills: Secondary | ICD-10-CM

## 2014-02-17 MED ORDER — LEVONORGESTREL-ETHINYL ESTRAD 0.15-30 MG-MCG PO TABS: 1.0000 | ORAL_TABLET | Freq: Every day | ORAL | Status: DC

## 2014-02-17 NOTE — Patient Instructions (Signed)
Will try this new pill for next several months and if you don't like to call back. New RX is for 1 year.

## 2014-02-17 NOTE — Progress Notes (Signed)
Patient ID: Jocelyn Griffin, female   DOB: 1991/03/12, 23 y.o.   MRN: [id number] S: 74 [address] G0P0 Married Hispanic Fe comes in today for a consult visit.  She has now been on Loestrin for 3 months. She is pleased that the days of her cycle are shorter but the flow is still heavy.  Less PMS, cramps, no headaches.  No flare of acne.  Still wants to make cycle lighter.  Some bloating.  She also had a bad yeast vaginitis after last here and that has resolved.  No vaginal discharge today.  Before OCP very heavy 3-4 days with cycle lasting 7 days. Also irregular at 40 - 60 days. She has a strong FMH of DM but unsure if PCOS. Prior to this she was on an OCP from [place] that is similar to [place] with a regular menses monthly. Menses lasting 5 days but is still heavy X 2 days. Then moderate to light. Super tampon and pad changing every 2-3 hours.    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  She will call back if needs to change.  Rx is sent to pharmacy.  Reviewed start date today and risk of OCP with DVT, CVA, cancer, etc.  Consult time 15 minutes face to face discussion

## 2014-02-19 ENCOUNTER — Ambulatory Visit: Payer: 59 | Admitting: Nurse Practitioner

## 2014-02-20 NOTE — Progress Notes (Signed)
Encounter reviewed by Dr. Brook Silva.  

## 2014-04-14 ENCOUNTER — Encounter: Payer: Self-pay | Admitting: Nurse Practitioner

## 2014-04-14 ENCOUNTER — Ambulatory Visit (INDEPENDENT_AMBULATORY_CARE_PROVIDER_SITE_OTHER): Payer: 59 | Admitting: Nurse Practitioner

## 2014-04-14 VITALS — BP 120/72 | HR 76 | Temp 98.6°F | Ht 64.0 in | Wt 174.0 lb

## 2014-04-14 DIAGNOSIS — B3731 Acute candidiasis of vulva and vagina: Secondary | ICD-10-CM

## 2014-04-14 DIAGNOSIS — R300 Dysuria: Secondary | ICD-10-CM

## 2014-04-14 DIAGNOSIS — B373 Candidiasis of vulva and vagina: Secondary | ICD-10-CM

## 2014-04-14 DIAGNOSIS — N912 Amenorrhea, unspecified: Secondary | ICD-10-CM

## 2014-04-14 LAB — POCT URINALYSIS DIPSTICK
Bilirubin, UA: NEGATIVE
GLUCOSE UA: NEGATIVE
Ketones, UA: NEGATIVE
Nitrite, UA: NEGATIVE
PH UA: 5
RBC UA: NEGATIVE
UROBILINOGEN UA: NEGATIVE

## 2014-04-14 LAB — POCT URINE PREGNANCY: Preg Test, Ur: POSITIVE

## 2014-04-14 NOTE — Progress Notes (Signed)
Subjective:     Patient ID: Jocelyn Griffin, female   DOB: [birth year], 23 y.o.   MRN: [id number]  HPI  This 23 yo Married Hispanic Fe presents with yeast vaginitis symptoms for about a week.  She has external irritation.  No vaginal discharge. She did use Nystatin with some relief.  She is also 2 days late with her menses and is concerned about pregnancy.  She went off her OCP with her last menses 03/14/14.  They thought they would go ahead and try to get pregnant thinking it would take them some time.  She has some urinary pressure but the dysuria she thinks is from the yeast.   Review of Systems  Constitutional: Positive for fatigue. Negative for fever and chills.  Cardiovascular: Negative.   Gastrointestinal: Negative.  Negative for nausea, vomiting, abdominal pain, diarrhea, constipation and abdominal distention.  Genitourinary: Positive for dysuria and vaginal discharge. Negative for urgency, frequency, hematuria, flank pain, genital sores, pelvic pain and dyspareunia.  Musculoskeletal: Negative.   Skin: Negative.   Neurological: Negative.   Psychiatric/Behavioral: Negative.        Objective:   Physical Exam  Constitutional: She is oriented to person, place, and time. She appears well-developed and well-nourished. No distress.  Abdominal: Soft. She exhibits no distension. There is no tenderness. There is no rebound and no guarding.  Genitourinary:  Thin vaginal discharge with externa irritation consistent with yeast.  Wet prep: PH:4.5; KOH: + yeast; NSS: negative. UPT: positive    Neurological: She is alert and oriented to person, place, and time.  Skin: Skin is warm and dry.  Psychiatric: She has a normal mood and affect. Her behavior is normal. Judgment and thought content normal.       Assessment:     Yeast vaginitis R/O UTI Amenorrhea with + UPT    Plan:     Will use OTC Monistat 7 and cautioned to be careful with applicator Will get PUS for viability of  pregnancy List of safe med's to use List of OB Will follow with urine C&S

## 2014-04-14 NOTE — Patient Instructions (Signed)

## 2014-04-15 LAB — URINE CULTURE
COLONY COUNT: NO GROWTH
ORGANISM ID, BACTERIA: NO GROWTH

## 2014-04-16 ENCOUNTER — Telehealth: Payer: Self-pay | Admitting: Nurse Practitioner

## 2014-04-16 NOTE — Telephone Encounter (Signed)
Spoke with patient. Advised that per benefit quote received, she will be responsible for $30 copay when she comes in for PUS. °Patient agreeable. °Scheduled PUS. °Advised patient of 72 hour cancellation policy and $100 cancellation fee. Patient agreeable. °

## 2014-04-16 NOTE — Progress Notes (Signed)
Encounter reviewed by Dr. Pleasant Bensinger Silva.  

## 2014-04-16 NOTE — Telephone Encounter (Signed)
Left message for patient to call back. Need to go over benefits and schedule PUS.  pr $30

## 2014-05-07 ENCOUNTER — Ambulatory Visit (INDEPENDENT_AMBULATORY_CARE_PROVIDER_SITE_OTHER): Payer: 59 | Admitting: Obstetrics and Gynecology

## 2014-05-07 ENCOUNTER — Other Ambulatory Visit: Payer: Self-pay | Admitting: Obstetrics and Gynecology

## 2014-05-07 ENCOUNTER — Encounter: Payer: Self-pay | Admitting: Obstetrics and Gynecology

## 2014-05-07 ENCOUNTER — Ambulatory Visit (INDEPENDENT_AMBULATORY_CARE_PROVIDER_SITE_OTHER): Payer: 59

## 2014-05-07 VITALS — BP 118/76 | HR 80 | Resp 20 | Ht 64.0 in | Wt 175.0 lb

## 2014-05-07 DIAGNOSIS — N912 Amenorrhea, unspecified: Secondary | ICD-10-CM

## 2014-05-07 LAB — US OB TRANSVAGINAL

## 2014-05-07 NOTE — Patient Instructions (Addendum)
But the book "What to Expect When Expecting."  Best wishes.

## 2014-05-07 NOTE — Progress Notes (Signed)
GYNECOLOGY  VISIT   HPI: 23 y.o.   Married  Caucasian  female   G0P0 with Patient's last menstrual period was 03/14/2014.   here for viability ultrasound.  Conceived off of contraception.  Some fatigue, minor nausea, breast tenderness and urinary frequency.  No vaginal bleeding.     GYNECOLOGIC HISTORY: Patient's last menstrual period was 03/14/2014.         OB History   Grav Para Term Preterm Abortions TAB SAB Ect Mult Living   0 0                 There are no active problems to display for this patient.   Past Medical History  Diagnosis Date  . Menorrhagia   . Oligomenorrhea   . Postcoital UTI     Past Surgical History  Procedure Laterality Date  . Wisdom tooth extraction  01/17/11    No current outpatient prescriptions on file.   No current facility-administered medications for this visit.     ALLERGIES: Review of patient's allergies indicates no known allergies.  Family History  Problem Relation Age of Onset  . Hypertension Father   . Diabetes Maternal Aunt   . Diabetes Maternal Grandmother   . Hypertension Maternal Grandmother   . Colon cancer Maternal Grandmother [id number]  . Cervical cancer Paternal Grandmother [id number]    History   Social History  . Marital Status: Married    Spouse Name: N/A    Number of Children: 0  . Years of Education: N/A   Occupational History  .  [employer]   Social History Main Topics  . Smoking status: Never Smoker   . Smokeless tobacco: Never Used  . Alcohol Use: No  . Drug Use: No  . Sexual Activity: Yes    Partners: Male   Other Topics Concern  . Not on file   Social History Narrative  . No narrative on file    ROS:  Pertinent items are noted in HPI.  PHYSICAL EXAMINATION:    BP 118/76  Pulse 80  Resp 20  Ht 5\' 4"  (1.626 m)  Wt 175 lb (79.379 kg)  BMI 30.02 kg/m2  LMP 03/14/2014     General appearance: alert, cooperative and appears stated age   Ultrasound below Size equals dates.  Viable  IUP. FH 150. Small left CL cyst.  Cx 4.07 cm.   ASSESSMENT  Early pregnancy.   PLAN  On PNV.  Counseled on prenatal care.   See labs:  No. Return after postpartum care is complete. Will establish care with Dr. Stefano GaulStringer.    An After Visit Summary was printed and given to the patient.  __25____ minutes face to face time of which over 50% was spent in counseling.

## 2014-05-07 NOTE — Addendum Note (Signed)
Addended by: Joeseph AmorFAST, Demetreus Lothamer L on: 05/07/2014 08:12 AM   Modules accepted: Orders

## 2014-06-09 ENCOUNTER — Other Ambulatory Visit: Payer: Self-pay | Admitting: Family Medicine

## 2014-06-09 LAB — OB RESULTS CONSOLE RUBELLA ANTIBODY, IGM: RUBELLA: IMMUNE

## 2014-06-09 LAB — OB RESULTS CONSOLE RPR: RPR: NONREACTIVE

## 2014-06-09 LAB — OB RESULTS CONSOLE GBS: GBS: POSITIVE

## 2014-06-09 LAB — OB RESULTS CONSOLE HIV ANTIBODY (ROUTINE TESTING): HIV: NONREACTIVE

## 2014-06-09 LAB — OB RESULTS CONSOLE HEPATITIS B SURFACE ANTIGEN: Hepatitis B Surface Ag: NEGATIVE

## 2014-06-09 LAB — SICKLE CELL SCREEN: SICKLE CELL SCREEN: NEGATIVE

## 2014-06-09 LAB — OB RESULTS CONSOLE ABO/RH: RH TYPE: POSITIVE

## 2014-06-09 LAB — OB RESULTS CONSOLE ANTIBODY SCREEN: ANTIBODY SCREEN: NEGATIVE

## 2014-06-09 NOTE — Telephone Encounter (Signed)
Medication filled x1 with no refills.   Requires office visit before any further refills can be given.   Letter sent.  

## 2014-09-25 LAB — OB RESULTS CONSOLE RPR: RPR: NONREACTIVE

## 2014-11-20 ENCOUNTER — Ambulatory Visit: Payer: 59 | Admitting: Nurse Practitioner

## 2014-11-20 ENCOUNTER — Telehealth: Payer: Self-pay | Admitting: Nurse Practitioner

## 2014-11-20 NOTE — Telephone Encounter (Signed)
Patient is pregnant and is seeing an OB for care. She will call back after she has her baby to schedule an appointment.

## 2014-11-24 ENCOUNTER — Telehealth (HOSPITAL_COMMUNITY): Payer: Self-pay | Admitting: *Deleted

## 2014-11-24 ENCOUNTER — Encounter (HOSPITAL_COMMUNITY): Payer: Self-pay | Admitting: *Deleted

## 2014-11-24 NOTE — Telephone Encounter (Signed)
Preadmission screen  

## 2014-11-28 ENCOUNTER — Inpatient Hospital Stay (HOSPITAL_COMMUNITY)
Admission: RE | Admit: 2014-11-28 | Discharge: 2014-11-28 | DRG: 782 | Disposition: A | Discharge status: Home / Self Care | Payer: 59 | Source: Ambulatory Visit | Attending: Obstetrics | Admitting: Obstetrics

## 2014-11-28 DIAGNOSIS — O321XX0 Maternal care for breech presentation, not applicable or unspecified: Principal | ICD-10-CM | POA: Diagnosis present

## 2014-11-28 DIAGNOSIS — Z3A37 37 weeks gestation of pregnancy: Secondary | ICD-10-CM | POA: Diagnosis present

## 2014-11-28 MED ORDER — TERBUTALINE SULFATE 1 MG/ML IJ SOLN
INTRAMUSCULAR | Status: AC
  Filled 2014-11-28: qty 1

## 2014-11-28 MED ORDER — LACTATED RINGERS IV SOLN: INTRAVENOUS | Status: DC

## 2014-11-28 MED ORDER — TERBUTALINE SULFATE 1 MG/ML IJ SOLN
0.2500 mg | Freq: Once | INTRAMUSCULAR | Status: AC
  Administered 2014-11-28: 0.25 mg via SUBCUTANEOUS

## 2014-11-28 NOTE — H&P (Signed)
Chief complaint: Breech position for version  History present illness: 24 year old G1 at 2237 weeks gestation presents for attempted external cephalic version due to persistent breech presentation. Patient reports good fetal movement no leakage of fluid, no vaginal bleeding, no contractions  Obstetric history: -  Pregnancy complicated by fetal arrhythmia identified at 30 weeks. Patient had fetal echocardiogram which noted frequent premature atrial contractions without concerning arrhythmia. Given the low risk of development of supraventricular tachycardia patient and monitored 3 times weekly to assess fetal rhythm. Baby has been remained without polyhydramnios, hydrops, sustained tachycardia and fetal testing has remained reactive. - GBS bacteriuria  Past Medical History  Diagnosis Date  . Menorrhagia   . Oligomenorrhea   . Postcoital UTI   . Asthma   . Fetal arrhythmia affecting pregnancy, antepartum     documented PACS by cardiologist    Past Surgical History  Procedure Laterality Date  . Wisdom tooth extraction  01/17/11    All: NKDA  Physical exam: Filed Vitals:   11/28/14 0751  BP: 126/83  Pulse: 74   Gen.: Well-appearing, no distress Abdomen: No right upper quadrant pain, soft, nontender, gravid without fundal tenderness GU deferred Tocometry. Rare irritability Fetal heart 120s positive accelerations no decelerations 10 beat variability Lower extremity: No edema  Attempted external cephalic version: Patient received IM terbutaline after reactive 20 minute NST. Bedside ultrasound confirms anterior placenta adequate AFI frank breech percent presentation with the head to the maternal right at the edge of the placenta. About 15 minutes were spent in attempted version with elevating the sacrum and attempting to guide the fetal vertex into a vertex position. Intermittently ultrasound was used to watch fetal heart which remains in a normal rhythm and appropriate rate. Patient tolerated  the procedure well however successful version was not accomplished  Postprocedure fetal monitoring toco: No contractions NST 120s, positive accelerations no decelerations 10 beat variability  Assessment and plan persistent breech presentation with failed attempted cephalic version. Patient is aware of risks of placental abruption rupture of membranes labor onset and precautions have been reviewed with the patient. Patient is also aware of plan for primary cesarean section around 39 weeks and less the baby spontaneously reverts on its own. Patient agrees to plan. We'll keep follow-up early next week for fetal assessment.  Rhetta Cleek A. 11/28/2014 9:03 AM

## 2014-12-01 ENCOUNTER — Other Ambulatory Visit: Payer: Self-pay | Admitting: Obstetrics

## 2014-12-10 DIAGNOSIS — O368390 Maternal care for abnormalities of the fetal heart rate or rhythm, unspecified trimester, not applicable or unspecified: Secondary | ICD-10-CM | POA: Insufficient documentation

## 2014-12-11 ENCOUNTER — Encounter (HOSPITAL_COMMUNITY): Payer: Self-pay

## 2014-12-11 NOTE — Patient Instructions (Addendum)
Your procedure is scheduled on:12/15/14  Enter through the Main Entrance at :9:45 am Pick up desk phone and dial [phone] and inform us of your arrival.  Please call [phone] if you have any problems the morning of surgery.  Remember: Do not eat food after midnight:Monday Water is ok until 7am on day of surgery.   You may brush your teeth the morning of surgery.   DO NOT wear jewelry, eye make-up, lipstick,body lotion, or dark fingernail polish.  (Polished toes are ok) You may wear deodorant.  If you are to be admitted after surgery, leave suitcase in car until your room has been assigned. Patients discharged on the day of surgery will not be allowed to drive home. Wear loose fitting, comfortable clothes for your ride home.

## 2014-12-14 ENCOUNTER — Encounter (HOSPITAL_COMMUNITY): Payer: Self-pay | Admitting: *Deleted

## 2014-12-14 ENCOUNTER — Encounter (HOSPITAL_COMMUNITY)
Admission: AD | Disposition: A | Discharge status: Home / Self Care | Payer: Self-pay | Source: Ambulatory Visit | Attending: Obstetrics

## 2014-12-14 ENCOUNTER — Inpatient Hospital Stay (HOSPITAL_COMMUNITY): Payer: 59 | Admitting: Anesthesiology

## 2014-12-14 ENCOUNTER — Encounter (HOSPITAL_COMMUNITY)
Admission: RE | Admit: 2014-12-14 | Discharge: 2014-12-14 | Disposition: A | Discharge status: Home / Self Care | Payer: 59 | Source: Ambulatory Visit | Attending: Obstetrics | Admitting: Obstetrics

## 2014-12-14 ENCOUNTER — Inpatient Hospital Stay (HOSPITAL_COMMUNITY)
Admission: AD | Admit: 2014-12-14 | Discharge: 2014-12-17 | DRG: 766 | Disposition: A | Discharge status: Home / Self Care | Payer: 59 | Source: Ambulatory Visit | Attending: Obstetrics | Admitting: Obstetrics

## 2014-12-14 ENCOUNTER — Encounter (HOSPITAL_COMMUNITY): Payer: Self-pay

## 2014-12-14 DIAGNOSIS — Z3A39 39 weeks gestation of pregnancy: Secondary | ICD-10-CM | POA: Diagnosis present

## 2014-12-14 DIAGNOSIS — O99824 Streptococcus B carrier state complicating childbirth: Secondary | ICD-10-CM | POA: Diagnosis present

## 2014-12-14 DIAGNOSIS — Z3403 Encounter for supervision of normal first pregnancy, third trimester: Secondary | ICD-10-CM | POA: Diagnosis present

## 2014-12-14 DIAGNOSIS — O321XX0 Maternal care for breech presentation, not applicable or unspecified: Principal | ICD-10-CM | POA: Diagnosis present

## 2014-12-14 LAB — TYPE AND SCREEN
ABO/RH(D): B POS
Antibody Screen: NEGATIVE

## 2014-12-14 LAB — CBC
HCT: 40.7 % (ref 36.0–46.0)
Hemoglobin: 13.9 g/dL (ref 12.0–15.0)
MCH: 29.7 pg (ref 26.0–34.0)
MCHC: 34.2 g/dL (ref 30.0–36.0)
MCV: 87 fL (ref 78.0–100.0)
Platelets: 359 10*3/uL (ref 150–400)
RBC: 4.68 MIL/uL (ref 3.87–5.11)
RDW: 14 % (ref 11.5–15.5)
WBC: 7.1 10*3/uL (ref 4.0–10.5)

## 2014-12-14 LAB — ABO/RH: ABO/RH(D): B POS

## 2014-12-14 SURGERY — Surgical Case
Anesthesia: Spinal | Site: Abdomen

## 2014-12-14 MED ORDER — KETOROLAC TROMETHAMINE 30 MG/ML IJ SOLN
INTRAMUSCULAR | Status: AC
  Administered 2014-12-14: 30 mg via INTRAMUSCULAR
  Filled 2014-12-14: qty 1

## 2014-12-14 MED ORDER — OXYTOCIN 40 UNITS IN LACTATED RINGERS INFUSION - SIMPLE MED: 62.5000 mL/h | INTRAVENOUS | Status: AC

## 2014-12-14 MED ORDER — IBUPROFEN 600 MG PO TABS
600.0000 mg | ORAL_TABLET | Freq: Four times a day (QID) | ORAL | Status: DC
  Administered 2014-12-15 – 2014-12-17 (×10): 600 mg via ORAL
  Filled 2014-12-14 (×10): qty 1

## 2014-12-14 MED ORDER — OXYTOCIN 10 UNIT/ML IJ SOLN
INTRAMUSCULAR | Status: AC
  Filled 2014-12-14: qty 4

## 2014-12-14 MED ORDER — SIMETHICONE 80 MG PO CHEW
80.0000 mg | CHEWABLE_TABLET | ORAL | Status: DC
  Administered 2014-12-14 – 2014-12-16 (×3): 80 mg via ORAL
  Filled 2014-12-14 (×3): qty 1

## 2014-12-14 MED ORDER — SIMETHICONE 80 MG PO CHEW
80.0000 mg | CHEWABLE_TABLET | Freq: Three times a day (TID) | ORAL | Status: DC
  Administered 2014-12-15 – 2014-12-17 (×8): 80 mg via ORAL
  Filled 2014-12-14 (×8): qty 1

## 2014-12-14 MED ORDER — DEXAMETHASONE SODIUM PHOSPHATE 4 MG/ML IJ SOLN
INTRAMUSCULAR | Status: DC | PRN
  Administered 2014-12-14: 4 mg via INTRAVENOUS

## 2014-12-14 MED ORDER — BUPIVACAINE IN DEXTROSE 0.75-8.25 % IT SOLN
INTRATHECAL | Status: DC | PRN
  Administered 2014-12-14: 1.8 mg via INTRATHECAL

## 2014-12-14 MED ORDER — FAMOTIDINE IN NACL 20-0.9 MG/50ML-% IV SOLN
20.0000 mg | Freq: Once | INTRAVENOUS | Status: AC
  Administered 2014-12-14: 20 mg via INTRAVENOUS
  Filled 2014-12-14: qty 50

## 2014-12-14 MED ORDER — ONDANSETRON HCL 4 MG/2ML IJ SOLN: 4.0000 mg | Freq: Once | INTRAMUSCULAR | Status: DC | PRN

## 2014-12-14 MED ORDER — NALOXONE HCL 0.4 MG/ML IJ SOLN: 0.4000 mg | INTRAMUSCULAR | Status: DC | PRN

## 2014-12-14 MED ORDER — KETOROLAC TROMETHAMINE 30 MG/ML IJ SOLN
30.0000 mg | Freq: Four times a day (QID) | INTRAMUSCULAR | Status: DC | PRN
  Administered 2014-12-14: 30 mg via INTRAMUSCULAR

## 2014-12-14 MED ORDER — ONDANSETRON HCL 4 MG/2ML IJ SOLN
INTRAMUSCULAR | Status: AC
  Filled 2014-12-14: qty 2

## 2014-12-14 MED ORDER — NALBUPHINE HCL 10 MG/ML IJ SOLN: 5.0000 mg | INTRAMUSCULAR | Status: DC | PRN

## 2014-12-14 MED ORDER — SENNOSIDES-DOCUSATE SODIUM 8.6-50 MG PO TABS
2.0000 | ORAL_TABLET | ORAL | Status: DC
  Administered 2014-12-14 – 2014-12-16 (×3): 2 via ORAL
  Filled 2014-12-14 (×3): qty 2

## 2014-12-14 MED ORDER — DIPHENHYDRAMINE HCL 25 MG PO CAPS
25.0000 mg | ORAL_CAPSULE | ORAL | Status: DC | PRN
  Administered 2014-12-15: 25 mg via ORAL
  Filled 2014-12-14: qty 1

## 2014-12-14 MED ORDER — TETANUS-DIPHTH-ACELL PERTUSSIS 5-2.5-18.5 LF-MCG/0.5 IM SUSP: 0.5000 mL | Freq: Once | INTRAMUSCULAR | Status: DC

## 2014-12-14 MED ORDER — SIMETHICONE 80 MG PO CHEW: 80.0000 mg | CHEWABLE_TABLET | ORAL | Status: DC | PRN

## 2014-12-14 MED ORDER — FENTANYL CITRATE 0.05 MG/ML IJ SOLN
INTRAMUSCULAR | Status: DC | PRN
  Administered 2014-12-14: 10 ug via INTRATHECAL

## 2014-12-14 MED ORDER — CITRIC ACID-SODIUM CITRATE 334-500 MG/5ML PO SOLN
30.0000 mL | Freq: Once | ORAL | Status: AC
  Administered 2014-12-14: 30 mL via ORAL
  Filled 2014-12-14: qty 15

## 2014-12-14 MED ORDER — NALBUPHINE HCL 10 MG/ML IJ SOLN: 5.0000 mg | Freq: Once | INTRAMUSCULAR | Status: AC | PRN

## 2014-12-14 MED ORDER — KETOROLAC TROMETHAMINE 30 MG/ML IJ SOLN: 30.0000 mg | Freq: Four times a day (QID) | INTRAMUSCULAR | Status: DC | PRN

## 2014-12-14 MED ORDER — MORPHINE SULFATE (PF) 0.5 MG/ML IJ SOLN
INTRAMUSCULAR | Status: DC | PRN
  Administered 2014-12-14: .2 mg via INTRATHECAL

## 2014-12-14 MED ORDER — MORPHINE SULFATE 0.5 MG/ML IJ SOLN
INTRAMUSCULAR | Status: AC
  Filled 2014-12-14: qty 10

## 2014-12-14 MED ORDER — DIPHENHYDRAMINE HCL 50 MG/ML IJ SOLN: 12.5000 mg | INTRAMUSCULAR | Status: DC | PRN

## 2014-12-14 MED ORDER — DIBUCAINE 1 % RE OINT: 1.0000 "application " | TOPICAL_OINTMENT | RECTAL | Status: DC | PRN

## 2014-12-14 MED ORDER — ZOLPIDEM TARTRATE 5 MG PO TABS: 5.0000 mg | ORAL_TABLET | Freq: Every evening | ORAL | Status: DC | PRN

## 2014-12-14 MED ORDER — CEFAZOLIN SODIUM-DEXTROSE 2-3 GM-% IV SOLR
INTRAVENOUS | Status: AC
Start: 2014-12-14 — End: 2014-12-14
  Filled 2014-12-14: qty 50

## 2014-12-14 MED ORDER — ONDANSETRON HCL 4 MG/2ML IJ SOLN
INTRAMUSCULAR | Status: DC | PRN
  Administered 2014-12-14: 4 mg via INTRAVENOUS

## 2014-12-14 MED ORDER — SCOPOLAMINE 1 MG/3DAYS TD PT72
MEDICATED_PATCH | TRANSDERMAL | Status: DC | PRN
  Administered 2014-12-14: 1 via TRANSDERMAL

## 2014-12-14 MED ORDER — SODIUM CHLORIDE 0.9 % IJ SOLN: 3.0000 mL | INTRAMUSCULAR | Status: DC | PRN

## 2014-12-14 MED ORDER — ONDANSETRON HCL 4 MG/2ML IJ SOLN: 4.0000 mg | Freq: Three times a day (TID) | INTRAMUSCULAR | Status: DC | PRN

## 2014-12-14 MED ORDER — FENTANYL CITRATE 0.05 MG/ML IJ SOLN
INTRAMUSCULAR | Status: AC
Start: 2014-12-14 — End: 2014-12-14
  Filled 2014-12-14: qty 5

## 2014-12-14 MED ORDER — LACTATED RINGERS IV SOLN
INTRAVENOUS | Status: DC | PRN
  Administered 2014-12-14: 16:00:00 via INTRAVENOUS

## 2014-12-14 MED ORDER — WITCH HAZEL-GLYCERIN EX PADS: 1.0000 "application " | MEDICATED_PAD | CUTANEOUS | Status: DC | PRN

## 2014-12-14 MED ORDER — ACETAMINOPHEN 325 MG PO TABS: 650.0000 mg | ORAL_TABLET | ORAL | Status: DC | PRN

## 2014-12-14 MED ORDER — PHENYLEPHRINE 8 MG IN D5W 100 ML (0.08MG/ML) PREMIX OPTIME
INJECTION | INTRAVENOUS | Status: DC | PRN
  Administered 2014-12-14: 50 ug/min via INTRAVENOUS

## 2014-12-14 MED ORDER — SCOPOLAMINE 1 MG/3DAYS TD PT72: 1.0000 | MEDICATED_PATCH | Freq: Once | TRANSDERMAL | Status: DC

## 2014-12-14 MED ORDER — OXYCODONE-ACETAMINOPHEN 5-325 MG PO TABS
1.0000 | ORAL_TABLET | ORAL | Status: DC | PRN
  Administered 2014-12-15 – 2014-12-17 (×7): 1 via ORAL
  Filled 2014-12-14 (×7): qty 1

## 2014-12-14 MED ORDER — SCOPOLAMINE 1 MG/3DAYS TD PT72
MEDICATED_PATCH | TRANSDERMAL | Status: AC
  Filled 2014-12-14: qty 1

## 2014-12-14 MED ORDER — MENTHOL 3 MG MT LOZG
1.0000 | LOZENGE | OROMUCOSAL | Status: DC | PRN
Start: 2014-12-14 — End: 2014-12-17

## 2014-12-14 MED ORDER — OXYTOCIN 10 UNIT/ML IJ SOLN
40.0000 [IU] | INTRAVENOUS | Status: DC | PRN
  Administered 2014-12-14: 40 [IU] via INTRAVENOUS

## 2014-12-14 MED ORDER — LACTATED RINGERS IV SOLN
INTRAVENOUS | Status: DC
  Administered 2014-12-15: 04:00:00 via INTRAVENOUS

## 2014-12-14 MED ORDER — LANOLIN HYDROUS EX OINT: 1.0000 "application " | TOPICAL_OINTMENT | CUTANEOUS | Status: DC | PRN

## 2014-12-14 MED ORDER — ACETAMINOPHEN 500 MG PO TABS
1000.0000 mg | ORAL_TABLET | Freq: Four times a day (QID) | ORAL | Status: AC
  Administered 2014-12-14 – 2014-12-15 (×2): 1000 mg via ORAL
  Filled 2014-12-14 (×2): qty 2

## 2014-12-14 MED ORDER — NALOXONE HCL 1 MG/ML IJ SOLN: 1.0000 ug/kg/h | INTRAVENOUS | Status: DC | PRN

## 2014-12-14 MED ORDER — CEFAZOLIN SODIUM-DEXTROSE 2-3 GM-% IV SOLR
2.0000 g | INTRAVENOUS | Status: DC
  Filled 2014-12-14: qty 50

## 2014-12-14 MED ORDER — PRENATAL MULTIVITAMIN CH
1.0000 | ORAL_TABLET | Freq: Every day | ORAL | Status: DC
  Administered 2014-12-15 – 2014-12-17 (×3): 1 via ORAL
  Filled 2014-12-14 (×3): qty 1

## 2014-12-14 MED ORDER — OXYCODONE-ACETAMINOPHEN 5-325 MG PO TABS: 2.0000 | ORAL_TABLET | ORAL | Status: DC | PRN

## 2014-12-14 MED ORDER — DEXAMETHASONE SODIUM PHOSPHATE 4 MG/ML IJ SOLN
INTRAMUSCULAR | Status: AC
  Filled 2014-12-14: qty 1

## 2014-12-14 MED ORDER — PHENYLEPHRINE 8 MG IN D5W 100 ML (0.08MG/ML) PREMIX OPTIME
INJECTION | INTRAVENOUS | Status: AC
  Filled 2014-12-14: qty 100

## 2014-12-14 MED ORDER — LACTATED RINGERS IV SOLN
INTRAVENOUS | Status: DC
  Administered 2014-12-14 (×2): via INTRAVENOUS

## 2014-12-14 MED ORDER — CEFAZOLIN SODIUM-DEXTROSE 2-3 GM-% IV SOLR
2.0000 g | INTRAVENOUS | Status: AC
  Administered 2014-12-14: 2 g via INTRAVENOUS

## 2014-12-14 MED ORDER — LACTATED RINGERS IV BOLUS (SEPSIS)
1000.0000 mL | Freq: Once | INTRAVENOUS | Status: AC
  Administered 2014-12-14: 1000 mL via INTRAVENOUS

## 2014-12-14 MED ORDER — DIPHENHYDRAMINE HCL 25 MG PO CAPS: 25.0000 mg | ORAL_CAPSULE | Freq: Four times a day (QID) | ORAL | Status: DC | PRN

## 2014-12-14 MED ORDER — MEPERIDINE HCL 25 MG/ML IJ SOLN: 6.2500 mg | INTRAMUSCULAR | Status: DC | PRN

## 2014-12-14 MED ORDER — FENTANYL CITRATE 0.05 MG/ML IJ SOLN: 25.0000 ug | INTRAMUSCULAR | Status: DC | PRN

## 2014-12-14 SURGICAL SUPPLY — 30 items
CLAMP CORD UMBIL (MISCELLANEOUS) IMPLANT
CLOTH BEACON ORANGE TIMEOUT ST (SAFETY) ×2 IMPLANT
CONTAINER PREFILL 10% NBF 15ML (MISCELLANEOUS) IMPLANT
DRAPE SHEET LG 3/4 BI-LAMINATE (DRAPES) IMPLANT
DRSG OPSITE POSTOP 4X10 (GAUZE/BANDAGES/DRESSINGS) ×2 IMPLANT
DURAPREP 26ML APPLICATOR (WOUND CARE) ×2 IMPLANT
ELECT REM PT RETURN 9FT ADLT (ELECTROSURGICAL) ×2
ELECTRODE REM PT RTRN 9FT ADLT (ELECTROSURGICAL) ×1 IMPLANT
EXTRACTOR VACUUM M CUP 4 TUBE (SUCTIONS) IMPLANT
GLOVE BIO SURGEON STRL SZ 6.5 (GLOVE) ×2 IMPLANT
GLOVE BIOGEL PI IND STRL 7.0 (GLOVE) ×1 IMPLANT
GLOVE BIOGEL PI INDICATOR 7.0 (GLOVE) ×1
GOWN STRL REUS W/TWL LRG LVL3 (GOWN DISPOSABLE) ×4 IMPLANT
KIT ABG SYR 3ML LUER SLIP (SYRINGE) IMPLANT
NEEDLE HYPO 25X5/8 SAFETYGLIDE (NEEDLE) IMPLANT
NS IRRIG 1000ML POUR BTL (IV SOLUTION) ×2 IMPLANT
PACK C SECTION WH (CUSTOM PROCEDURE TRAY) ×2 IMPLANT
PAD OB MATERNITY 4.3X12.25 (PERSONAL CARE ITEMS) ×2 IMPLANT
STAPLER VISISTAT 35W (STAPLE) IMPLANT
STRIP CLOSURE SKIN 1/2X4 (GAUZE/BANDAGES/DRESSINGS) IMPLANT
SUT MON AB 4-0 PS1 27 (SUTURE) ×2 IMPLANT
SUT PLAIN 0 NONE (SUTURE) IMPLANT
SUT PLAIN 2 0 XLH (SUTURE) ×2 IMPLANT
SUT VIC AB 0 CT1 36 (SUTURE) ×4 IMPLANT
SUT VIC AB 0 CTX 36 (SUTURE) ×2
SUT VIC AB 0 CTX36XBRD ANBCTRL (SUTURE) ×2 IMPLANT
SUT VIC AB 2-0 CT1 27 (SUTURE) ×1
SUT VIC AB 2-0 CT1 TAPERPNT 27 (SUTURE) ×1 IMPLANT
TOWEL OR 17X24 6PK STRL BLUE (TOWEL DISPOSABLE) ×2 IMPLANT
TRAY FOLEY CATH 14FR (SET/KITS/TRAYS/PACK) IMPLANT

## 2014-12-14 NOTE — Anesthesia Postprocedure Evaluation (Signed)
  Anesthesia Post-op Note  Patient: Jocelyn Griffin  Procedure(s) Performed: Procedure(s) (LRB): CESAREAN SECTION (N/A)  Patient Location: PACU  Anesthesia Type: Spinal  Level of Consciousness: awake and alert   Airway and Oxygen Therapy: Patient Spontanous Breathing  Post-op Pain: mild  Post-op Assessment: Post-op Vital signs reviewed, Patient's Cardiovascular Status Stable, Respiratory Function Stable, Patent Airway and No signs of Nausea or vomiting  Last Vitals:  Filed Vitals:   12/14/14 1730  BP: 115/58  Pulse: 79  Temp:   Resp: 14    Post-op Vital Signs: stable   Complications: No apparent anesthesia complications

## 2014-12-14 NOTE — H&P (Signed)
Vernie MurdersDenise A Tedder is a 24 y.o. G1P0 at 6066w2d presenting for active labor with planned PCS tomorrow for breech presentation. Pt notes onset contractions at 2 am. Good fetal movement, No vaginal bleeding, not leaking fluid   [facility] 1st [facility]. - Breech, failed ECV at 36 wks - GBS bacteruria - undisclosed gender - fetal arrhythmia. Echo x 2- PAC's, fetus monitored closely,no development of SVT or fetal compromise, weekly BPPs   Prenatal Transfer Tool  Maternal Diabetes: No Genetic Screening: Declined Maternal Ultrasounds/Referrals: Normal Fetal Ultrasounds or other Referrals:  Fetal echo Maternal Substance Abuse:  No Significant Maternal Medications:  None Significant Maternal Lab Results: None     OB History    Gravida Para Term Preterm AB TAB SAB Ectopic Multiple Living   1 0             Past Medical History  Diagnosis Date  . Menorrhagia   . Oligomenorrhea   . Postcoital UTI   . Fetal arrhythmia affecting pregnancy, antepartum     documented PACS by cardiologist  . Asthma     last inhaler use a yr ago   Past Surgical History  Procedure Laterality Date  . Wisdom tooth extraction  01/17/11   Family History: family history includes Cervical cancer (age of onset: 5378) in her paternal grandmother; Colon cancer (age of onset: 2974) in her maternal grandmother; Diabetes in her maternal aunt and maternal grandmother; Hypertension in her father and maternal grandmother. Social History:  reports that she has never smoked. She has never used smokeless tobacco. She reports that she does not drink alcohol or use illicit drugs.  Review of Systems - Negative except contractions     Height 5' 3.5" (1.613 m), weight 88.451 kg (195 lb), last menstrual period 03/14/2014.  Physical Exam:  Gen: well appearing, no distress  Abd: gravid, NT, no RUQ pain LE: 1+ edema, equal bilaterally, non-tender RTUS: frank breech, nl AFI, nl fetal cardiac rhythm  Prenatal  labs: ABO, Rh: --/--/B POS, B POS (03/28 1215) Antibody: NEG (03/28 1215) Rubella:  immune RPR: Nonreactive (01/08 0000)  HBsAg: Negative (09/22 0000)  HIV: Non-reactive (09/22 0000)  GBS: Positive (09/22 0000)  1 hr Glucola 135, nl 3 hr  Genetic screening declined Anatomy US nl   Assessment/Plan: 24 y.o. G1P0 at 10066w2d Active labor, 4 cm in office, plan PCS now R/B of c/s d/w pt   Brenleigh Collet A. 12/14/2014, 3:26 PM

## 2014-12-14 NOTE — Op Note (Signed)
12/14/2014  5:28 PM  PATIENT:  Jocelyn Griffin  24 y.o. female  PRE-OPERATIVE DIAGNOSIS:  Labor breech presentation   POST-OPERATIVE DIAGNOSIS:  Labor breech presentation   PROCEDURE:  Procedure(s): CESAREAN SECTION (N/A)  PCS, LTCS with 2 layer closure  SURGEON:  Surgeon(s) and Role:    * Noland Fordyce, MD - Primary  PHYSICIAN ASSISTANT:   ASSISTANTS: Bhambri   ANESTHESIA:   spinal  EBL:  Total I/O In: 2400 [I.V.:2400] Out: 800 [Urine:100; Blood:700]  BLOOD ADMINISTERED:none  DRAINS: Urinary Catheter (Foley)   LOCAL MEDICATIONS USED:  NONE  SPECIMEN:  Source of Specimen:  Placenta  DISPOSITION OF SPECIMEN:  L&D  COUNTS:  YES  TOURNIQUET:  * No tourniquets in log *  DICTATION: .Note written in EPIC  PLAN OF CARE: Admit to inpatient   PATIENT DISPOSITION:  PACU - hemodynamically stable.   Delay start of Pharmacological VTE agent (>24hrs) due to surgical blood loss or risk of bleeding: yes   Findings:  @ infant,  APGAR (1 MIN): 9   APGAR (5 MINS): 9   APGAR (10 MINS):   Normal uterus, tubes and ovaries, normal placenta. 3VC, clear amniotic fluid  EBL: per anasthesia Antibiotics:   2g Ancef Complications: none  Indications: This is a 24 y.o. year-old, G1  At [redacted]w[redacted]d admitted for active labor with known breech presentation. Risks benefits and alternatives of the procedure were discussed with the patient who agreed to proceed  Procedure:  After informed consent was obtained the patient was taken to the operating room where spinal anesthesia was initiated.  She was prepped and draped in the normal sterile fashion in dorsal supine position with a leftward tilt.  A foley catheter was in place.  A Pfannenstiel skin incision was made 2 cm above the pubic symphysis in the midline with the scalpel.  Dissection was carried down with the Bovie cautery until the fascia was reached. The fascia was incised in the midline. The incision was extended laterally with  the Mayo scissors. The inferior aspect of the fascial incision was grasped with the Coker clamps, elevated up and the underlying rectus muscles were dissected off sharply. The superior aspect of the fascial incision was grasped with the Coker clamps elevated up and the underlying rectus muscles were dissected off sharply.  The peritoneum was entered bluntly. The peritoneal incision was extended superiorly and inferiorly with good visualization of the bladder. The bladder blade was inserted and palpation was done to assess the fetal position and the location of the uterine vessels. The lower segment of the uterus was incised sharply with the scalpel and extended  bluntly in the cephalo-caudal fashion. The bandage scissors were used to extend the incision at the left angle. The infant sacrum  was grasped, brought to the incision and sacrum delivered. The legs followed in flexion. The left then right should delivered spontaneously with rotating the infant and the head was delivered in flexion. The cord was clamped and cut. The infant was handed off to the waiting pediatrician. The placenta was expressed. The uterus was exteriorized. The uterus was cleared of all clots and debris. NO etiology for the persistent breech was noted. The uterine incision was repaired with 0 Vicryl in a running locked fashion.  A second layer of the same suture was used in an imbricating fashion to obtain excellent hemostasis.  The uterus was then returned to the abdomen, the gutters were cleared of all clots and debris. The uterine incision was reinspected and found  to be hemostatic. The peritoneum was grasped and closed with 2-0 Vicryl in a running fashion. The cut muscle edges and the underside of the fascia were inspected and found to be hemostatic. The fascia was closed with 0 Vicryl in a single running suture. The subcutaneous tissue was irrigated. Scarpa's layer was closed with a 2-0 plain gut suture. The skin was closed with a 4-0  Monocryl in a single layer. The patient tolerated the procedure well. Sponge lap and needle counts were correct x3 and patient was taken to the recovery room in a stable condition.  Jocelyn Griffin A. 12/14/2014 5:29 PM

## 2014-12-14 NOTE — Transfer of Care (Signed)
Immediate Anesthesia Transfer of Care Note  Patient: Jocelyn Griffin  Procedure(s) Performed: Procedure(s): CESAREAN SECTION (N/A)  Patient Location: PACU  Anesthesia Type:Spinal  Level of Consciousness: awake  Airway & Oxygen Therapy: Patient Spontanous Breathing  Post-op Assessment: Report given to RN and Post -op Vital signs reviewed and stable  Post vital signs: stable  Last Vitals:  Filed Vitals:   12/14/14 1533  BP: 121/77  Pulse: 110  Temp: 37.5 C  Resp: 17    Complications: No apparent anesthesia complications

## 2014-12-14 NOTE — Anesthesia Preprocedure Evaluation (Addendum)
Anesthesia Evaluation  Patient identified by MRN, date of birth, ID band Patient awake    Reviewed: Allergy & Precautions, NPO status , Patient's Chart, lab work & pertinent test results  History of Anesthesia Complications Negative for: history of anesthetic complications  Airway Mallampati: II  TM Distance: >3 FB Neck ROM: Full    Dental no notable dental hx. (+) Dental Advisory Given   Pulmonary asthma ,  breath sounds clear to auscultation  Pulmonary exam normal       Cardiovascular negative cardio ROS  Rhythm:Regular Rate:Normal     Neuro/Psych negative neurological ROS  negative psych ROS   GI/Hepatic negative GI ROS, Neg liver ROS,   Endo/Other  obesity  Renal/GU negative Renal ROS  negative genitourinary   Musculoskeletal negative musculoskeletal ROS (+)   Abdominal   Peds negative pediatric ROS (+)  Hematology negative hematology ROS (+)   Anesthesia Other Findings   Reproductive/Obstetrics (+) Pregnancy                             Anesthesia Physical Anesthesia Plan  ASA: II  Anesthesia Plan: Spinal   Post-op Pain Management:    Induction:   Airway Management Planned:   Additional Equipment:   Intra-op Plan:   Post-operative Plan:   Informed Consent: I have reviewed the patients History and Physical, chart, labs and discussed the procedure including the risks, benefits and alternatives for the proposed anesthesia with the patient or authorized representative who has indicated his/her understanding and acceptance.   Dental advisory given  Plan Discussed with: CRNA  Anesthesia Plan Comments: (Breech presentation, Primary C/S. Last ate a solid meal 2 hours prior but continues to contract and dilate and must proceed on with C/S prior to 8hour NPO status per [facility] physician. Will give pepcid and bicitra prior to OR. )       Anesthesia Quick Evaluation

## 2014-12-14 NOTE — Brief Op Note (Signed)
12/14/2014  5:28 PM  PATIENT:  Jocelyn Griffin  24 y.o. female  PRE-OPERATIVE DIAGNOSIS:  Labor breech presentation   POST-OPERATIVE DIAGNOSIS:  Labor breech presentation   PROCEDURE:  Procedure(s): CESAREAN SECTION (N/A)  PCS, LTCS with 2 layer closure  SURGEON:  Surgeon(s) and Role:    * Noland FordyceKelly Henley Boettner, MD - Primary  PHYSICIAN ASSISTANT:   ASSISTANTS: Bhambri   ANESTHESIA:   spinal  EBL:  Total I/O In: 2400 [I.V.:2400] Out: 800 [Urine:100; Blood:700]  BLOOD ADMINISTERED:none  DRAINS: Urinary Catheter (Foley)   LOCAL MEDICATIONS USED:  NONE  SPECIMEN:  Source of Specimen:  Placenta  DISPOSITION OF SPECIMEN:  L&D  COUNTS:  YES  TOURNIQUET:  * No tourniquets in log *  DICTATION: .Note written in EPIC  PLAN OF CARE: Admit to inpatient   PATIENT DISPOSITION:  PACU - hemodynamically stable.   Delay start of Pharmacological VTE agent (>24hrs) due to surgical blood loss or risk of bleeding: yes

## 2014-12-14 NOTE — Anesthesia Procedure Notes (Signed)
Spinal Patient location during procedure: OR Start time: 12/14/2014 4:00 PM Staffing Anesthesiologist: Karie SchwalbeJUDD, Hicks Feick Performed by: anesthesiologist  Preanesthetic Checklist Completed: patient identified, site marked, surgical consent, pre-op evaluation, timeout performed, IV checked, risks and benefits discussed and monitors and equipment checked Spinal Block Patient position: sitting Prep: ChloraPrep Patient monitoring: continuous pulse ox, blood pressure and heart rate Approach: midline Location: L3-4 Injection technique: single-shot Needle Needle type: Sprotte  Needle gauge: 24 G Needle length: 9 cm Assessment Sensory level: T4 Additional Notes Functioning IV was confirmed and monitors were applied. Sterile prep and drape, including hand hygiene, mask and sterile gloves were used. The patient was positioned and the spine was prepped. The skin was anesthetized with lidocaine.  Free flow of clear CSF was obtained prior to injecting local anesthetic into the CSF.  The spinal needle aspirated freely following injection.  The needle was carefully withdrawn.  The patient tolerated the procedure well. Consent was obtained prior to procedure with all questions answered and concerns addressed. Risks including but not limited to bleeding, infection, nerve damage, paralysis, failed block, inadequate analgesia, allergic reaction, high spinal, itching and headache were discussed and the patient wished to proceed.   Karie SchwalbeMary Anadia Helmes, MD

## 2014-12-15 ENCOUNTER — Inpatient Hospital Stay (HOSPITAL_COMMUNITY): Admission: RE | Admit: 2014-12-15 | Payer: 59 | Source: Ambulatory Visit | Admitting: Obstetrics

## 2014-12-15 ENCOUNTER — Encounter (HOSPITAL_COMMUNITY): Payer: Self-pay | Admitting: Obstetrics

## 2014-12-15 LAB — CBC
HEMATOCRIT: 33.3 % — AB (ref 36.0–46.0)
Hemoglobin: 11.4 g/dL — ABNORMAL LOW (ref 12.0–15.0)
MCH: 29.6 pg (ref 26.0–34.0)
MCHC: 34.2 g/dL (ref 30.0–36.0)
MCV: 86.5 fL (ref 78.0–100.0)
PLATELETS: 311 10*3/uL (ref 150–400)
RBC: 3.85 MIL/uL — AB (ref 3.87–5.11)
RDW: 13.5 % (ref 11.5–15.5)
WBC: 11.3 10*3/uL — ABNORMAL HIGH (ref 4.0–10.5)

## 2014-12-15 LAB — RPR: RPR Ser Ql: NONREACTIVE

## 2014-12-15 SURGERY — Surgical Case
Anesthesia: Spinal

## 2014-12-15 NOTE — Anesthesia Postprocedure Evaluation (Signed)
  Anesthesia Post-op Note  Patient: Jocelyn Griffin  Procedure(s) Performed: Procedure(s): CESAREAN SECTION (N/A)  Patient Location: Mother/Baby  Anesthesia Type:Spinal  Level of Consciousness: awake, alert  and oriented  Airway and Oxygen Therapy: Patient Spontanous Breathing  Post-op Pain: none  Post-op Assessment: Post-op Vital signs reviewed, Patient's Cardiovascular Status Stable, Respiratory Function Stable, Pain level controlled, No headache, No backache, No residual numbness and No residual motor weakness  Post-op Vital Signs: Reviewed and stable  Last Vitals:  Filed Vitals:   12/15/14 0345  BP: 106/80  Pulse: 70  Temp:   Resp: 18    Complications: No apparent anesthesia complications

## 2014-12-15 NOTE — Progress Notes (Signed)
Patient ID: Jocelyn Griffin, female   DOB: Dec 13, 1990, 24 y.o.   MRN: [id number] POD# 1 / S/P Cesarean Delivery for Breech Presentation  Subjective:  Information for the patient's newborn:  Lorelee Marketeeters, Girl Angelique BlonderDenise [[id number]][[id number]]  female   Reports feeling well / had a syncopal episode while up to the BR this morning, but feeling much better now Feeding: breast Patient reports tolerating PO.  Breast symptoms: none Pain controlled with ibuprofen (OTC) and narcotic analgesics including Percocet Denies HA/SOB/C/P/N/V/dizziness. Flatus present. No BM. She reports vaginal bleeding as normal, without clots.  She is ambulating, urinating without difficult.     Objective:   VS:  Filed Vitals:   12/15/14 0340 12/15/14 0342 12/15/14 0345 12/15/14 0800  BP: 120/70 113/61 106/80 110/70  Pulse: 75 68 70 80  Temp: 99.1 F (37.3 C)   98.7 F (37.1 C)  TempSrc: Oral   Oral  Resp: 18 18 18 20   Height:      Weight:      SpO2: 96%   98%     Intake/Output Summary (Last 24 hours) at 12/15/14 0851 Last data filed at 12/15/14 0800  Gross per 24 hour  Intake   2540 ml  Output   2300 ml  Net    240 ml        Recent Labs  12/14/14 1215 12/15/14 0550  WBC 7.1 11.3*  HGB 13.9 11.4*  HCT 40.7 33.3*  PLT 359 311     Blood type: B POS (03/28 1215)  Rubella: Immune (09/22 0000)     Physical Exam:  General: alert, cooperative and no distress CV: Regular rate and rhythm, S1S2 present or without murmur or extra heart sounds Resp: clear Abdomen: soft, nontender, normal bowel sounds Incision: Tegaderm and Honeycomb - C/D/I - skin well-approximated with sutures Uterine Fundus: firm, U-even, nontender Lochia: minimal Ext: extremities normal, atraumatic, no cyanosis or edema, Homans sign is negative, no sign of DVT and no edema, redness or tenderness in the calves or thighs   Assessment/Plan: 24 y.o.   POD# 1.  s/p Cesarean Delivery.  Indications: breech                Principal Problem:  Postpartum care following cesarean delivery (3/28) Active Problems:   Breech presentation  Doing well, stable.               Regular diet as tolerated D/C IV per protocol Ambulate Routine post-op care  Kenard GowerAWSON, George Haggart, M, MSN, CNM 12/15/2014, 8:51 AM

## 2014-12-15 NOTE — Lactation Note (Signed)
This note was copied from the chart of Jocelyn Florestine AversDenise Dolata. Lactation Consultation Note  Patient Name: Jocelyn Griffin Date: 12/15/2014 Reason for consult: Follow-up assessment  Baby is 21 hours old and has been to the breast several times , 3 wets , 5 stools, Latch scores 6-7 prior to consult. Breast feeding range 10 -35 mins.  @ consult worked on positioning and depth and scores improved to 7-8.  Baby awake and rooting , LC placed baby skin to skin, and assisted mom with latching, hand expressing ( several drops of colostrum noted ),  Latched with depth and fed for 19 mins with swallows , increased with breast compressions. Assisted with latch on the right breast , noted some areola edema at the base of the nipple. Latch , per mom some intermittent pinching noted. Fed 12 mins , and relatched for a few sucks ( baby opened wider 2nd latch ).  after breast massage , hand express, pre - pump if needed to make the nipple areola complex more elastic andthen reverse pressue . LC instructed mom on the use hand pump and shells .  Mother informed of post-discharge support and given phone number to the lactation department, including services for phone call assistance; out-patient appointments; and breastfeeding support group. List of other breastfeeding resources in the community given in the handout. Encouraged mother to call for problems or concerns related to breastfeeding.   Maternal Data Has patient been taught Hand Expression?: Yes  Feeding Feeding Type: Breast Fed Length of feed: 12 min  LATCH Score/Interventions Latch: Grasps breast easily, tongue down, lips flanged, rhythmical sucking. Intervention(s): Adjust position;Assist with latch;Breast massage;Breast compression  Audible Swallowing: A few with stimulation  Type of Nipple: Everted at rest and after stimulation (some swelling at the base nippple )  Comfort (Breast/Nipple): Filling, red/small blisters or bruises,  mild/mod discomfort (per mom intermittent pinching )     Hold (Positioning): Assistance needed to correctly position infant at breast and maintain latch. Intervention(s): Breastfeeding basics reviewed;Support Pillows;Position options;Skin to skin  LATCH Score: 7  Lactation Tools Discussed/Used Tools: Shells;Pump Shell Type: Inverted Breast pump type: Manual Pump Review: Setup, frequency, and cleaning Initiated by:: MAI  Date initiated:: 12/15/14   Consult Status Consult Status: Follow-up Date: 12/16/14 Follow-up type: In-patient    Kathrin Greathouseorio, Lisanne Ponce Ann 12/15/2014, 1:55 PM

## 2014-12-15 NOTE — Addendum Note (Signed)
Addendum  created 12/15/14 [id number] by Shanon PayorSuzanne M Danean Marner, CRNA   Modules edited: Notes Section   Notes Section:  File: [id number]

## 2014-12-15 NOTE — Lactation Note (Signed)
This note was copied from the chart of Jocelyn Griffin. Lactation Consultation Note  Patient Name: Jocelyn Griffin QMVHQ'IToday's Date: 12/15/2014 Reason for consult: Initial assessment;Other (Comment) (baby presently sleeping , and mom is having poor pain control )  Baby is 7719 hour old, presently being held by dad sleeping. Has been to the breast several times 3 wets , 5 mec. Stools. Latch scores x 3 6-7 . Presently mom is having increased pain and the RN is planning on getting up to the bathroom .  LC recommended calling on the nurses light when the baby is showing feeding cues. LC also discussed the importance  Of skin to skin. Family also aware for mom to call for feeding assessment.    Maternal Data    Feeding    LATCH Score/Interventions                Intervention(s): Breastfeeding basics reviewed     Lactation Tools Discussed/Used     Consult Status Consult Status: Follow-up Date: 12/15/14 Follow-up type: In-patient    Jocelyn Griffin, Jocelyn Griffin 12/15/2014, 12:13 PM

## 2014-12-16 NOTE — Lactation Note (Signed)
This note was copied from the chart of Jocelyn Jocelyn AversDenise Lafauci. Lactation Consultation Note  Patient Name: Jocelyn Griffin AVWUJ'WToday's Date: 12/16/2014 Reason for consult: Follow-up assessment  Baby 3342 1/2 hrs old and per mom recently breast fed for 45 mins. Per mom having some nipples tender ness, LC assessed breast tissue  With moms permission , areola edema improved compared to yesterday.  Mom has been using comfort gels , and shells. LC reviewed sore nipple prevention - prior to latch massage, hand express,  pre-pump to make the nipple and the areola more elastic , then reverse pressure. Breast compressions with latch until swallows and then intermittent.    Maternal Data    Feeding Feeding Type:  (baby recently fed for 45 mins ) Length of feed: 45 min (per mom )  LATCH Score/Interventions Latch: Grasps breast easily, tongue down, lips flanged, rhythmical sucking. Intervention(s): Assist with latch  Audible Swallowing: A few with stimulation Intervention(s): Skin to skin;Hand expression  Type of Nipple: Everted at rest and after stimulation  Comfort (Breast/Nipple): Filling, red/small blisters or bruises, mild/mod discomfort  Problem noted: Mild/Moderate discomfort Interventions (Mild/moderate discomfort): Comfort gels;Pre-pump if needed;Hand expression;Hand massage  Hold (Positioning): Assistance needed to correctly position infant at breast and maintain latch. Intervention(s): Breastfeeding basics reviewed  LATCH Score: 7  Lactation Tools Discussed/Used     Consult Status Consult Status: Follow-up Date: 12/17/14 Follow-up type: In-patient    Kathrin Greathouseorio, Ryli Standlee Ann 12/16/2014, 12:19 PM

## 2014-12-16 NOTE — Progress Notes (Signed)
POD # 2  Subjective: Pt reports feeling well/ Pain controlled with Motrin and Percocet Tolerating po/Voiding without problems/ No n/v/ Flatus present Activity: ad lib Bleeding is light Newborn info:  Information for the patient's newborn:  Jocelyn Griffin, Girl Brynja [[id number]][[id number]]  female   Feeding: breast, nipples sore   Objective: VS:  Filed Vitals:   12/15/14 1300 12/15/14 1638 12/15/14 1723 12/16/14 0517  BP: 120/67 114/66 127/80 119/62  Pulse: 80 76 70 76  Temp: 98.3 F (36.8 C) 98.3 F (36.8 C) 98.4 F (36.9 C) 97.8 F (36.6 C)  TempSrc: Oral Oral Oral Oral  Resp: 20 20 20 20   Height:      Weight:      SpO2: 99%       I&O: Intake/Output      03/29 0701 - 03/30 0700 03/30 0701 - 03/31 0700   I.V. (mL/kg)     Total Intake(mL/kg)     Urine (mL/kg/hr) 3400 (1.6)    Blood     Total Output 3400     Net -3400            LABS:  Recent Labs  12/14/14 1215 12/15/14 0550  WBC 7.1 11.3*  HGB 13.9 11.4*  HCT 40.7 33.3*  PLT 359 311    Blood type: --/--/B POS, B POS (03/28 1215) Rubella: Immune (09/22 0000)     Physical Exam:  General: alert and cooperative CV: Regular rate and rhythm Resp: CTA bilaterally Abdomen: soft, nontender, normal bowel sounds Uterine Fundus: firm, below umbilicus, nontender Incision: Covered with Tegaderm and honeycomb dressing; no significant drainage, edema, bruising, or erythema; well approximated with suture Lochia: minimal Ext: extremities normal, atraumatic, no cyanosis or edema and Homans sign is negative, no sign of DVT  Breast: bilateral nipples intact, pink-no fissure or cracks  Assessment/: POD # 2/ G1P1001/ S/P C/Section d/t breech Doing well  Plan: Lactation and nursing to help with latch, continue soothing gel pads and shells Continue routine post op orders Anticipate discharge home tomorrow   Signed: Donette LarryBHAMBRI, Lenell Lama, Dorris CarnesN, MSN, CNM 12/16/2014, 9:30 AM

## 2014-12-17 ENCOUNTER — Encounter (HOSPITAL_COMMUNITY): Payer: Self-pay | Admitting: *Deleted

## 2014-12-17 MED ORDER — LANOLIN HYDROUS EX OINT: 1.0000 "application " | TOPICAL_OINTMENT | CUTANEOUS | Status: DC | PRN

## 2014-12-17 MED ORDER — IBUPROFEN 600 MG PO TABS: 600.0000 mg | ORAL_TABLET | Freq: Four times a day (QID) | ORAL | Status: DC

## 2014-12-17 MED ORDER — OXYCODONE-ACETAMINOPHEN 5-325 MG PO TABS: 1.0000 | ORAL_TABLET | ORAL | Status: DC | PRN

## 2014-12-17 NOTE — Progress Notes (Signed)
POD # 3 S/P Cesarean  Delivery for Breech Presentation  Subjective: Pt reports feeling well/ Pain controlled with Motrin and Percocet Tolerating po/Voiding without problems/ No n/v/ Flatus present Activity: ad lib Bleeding is light Newborn info:  Information for the patient's newborn:  Jocelyn Griffin, Girl Sariyah [[id number]][[id number]]  female    Feeding: breast, nipples sore   Objective: VS:  Filed Vitals:   12/15/14 1723 12/16/14 0517 12/16/14 1743 12/17/14 0550  BP: 127/80 119/62 116/75 118/75  Pulse: 70 76 76 66  Temp: 98.4 F (36.9 C) 97.8 F (36.6 C) 97.6 F (36.4 C) 98 F (36.7 C)  TempSrc: Oral Oral Oral Oral  Resp: 20 20 18 20   Height:      Weight:      SpO2:        I&O: Intake/Output      03/30 0701 - 03/31 0700 03/31 0701 - 04/01 0700   Urine (mL/kg/hr)     Total Output       Net              LABS:   Recent Labs  12/14/14 1215 12/15/14 0550  WBC 7.1 11.3*  HGB 13.9 11.4*  HCT 40.7 33.3*  PLT 359 311    Blood type: B POS (03/28 1215) Rubella: Immune (09/22 0000)     Physical Exam:  General: alert and cooperative CV: Regular rate and rhythm Resp: CTA bilaterally Abdomen: soft, nontender, normal bowel sounds Uterine Fundus: firm, 2 FB below umbilicus, nontender Incision: Covered with Tegaderm and honeycomb dressing; no significant drainage, edema, bruising, or erythema; well approximated with suture Lochia: minimal Ext: extremities normal, atraumatic, no cyanosis or edema and Homans sign is negative, no sign of DVT  Breast: bilateral nipples intact, pink-no fissure or cracks  Assessment/: POD # 3 / G1P1001 / S/P C/Section d/t breech Doing well  Plan: Lactation and nursing to help with latch, continue soothing gel pads and shells Continue routine post op orders D/C home today   Signed: Kenard GowerAWSON, Zeeva Courser, M, MSN, CNM 12/17/2014, 9:12 AM

## 2014-12-17 NOTE — Lactation Note (Signed)
This note was copied from the chart of Jocelyn Griffin. Lactation Consultation Note  Baby has been BF often but has lost more than 10% of birth weight.  I assisted mom with a deeper latch and mom reported increased comfort.  Through out the feeding she slid down the areola.  Much stimulation including breast compression was necessary to assist her with milk transfer.  Mom reports that she tires at the breast quickly.  Oral evaluation using a gloved finger reveals difficulty maintaining vacuum.  Snapback was heard and her mouth visably opened when the suction broke.  She has a fleshy wide upper labial frenum that inserts close to the alveolar ridge.  Her tongue is bowl shaped and does not elevate well when she cries.  A posterior frenum is noted.  The anterior palate also is bubble shaped.  Dr. Margo AyeHall was notified and agrees with this assessment.  Plan for now is to BF, post pump and feed any expressed amount back to the Jocelyn Griffin.  Patient Name: Jocelyn Griffin WUJWJ'XToday's Date: 12/17/2014     Maternal Data    Feeding Feeding Type: Breast Fed Length of feed: 30 min  LATCH Score/Interventions Latch: Repeated attempts needed to sustain latch, nipple held in mouth throughout feeding, stimulation needed to elicit sucking reflex.  Audible Swallowing: A few with stimulation  Type of Nipple: Everted at rest and after stimulation  Comfort (Breast/Nipple): Filling, red/small blisters or bruises, mild/mod discomfort     Hold (Positioning): Assistance needed to correctly position infant at breast and maintain latch.  LATCH Score: 6  Lactation Tools Discussed/Used Pump Review: Setup, frequency, and cleaning;Milk Storage;Other (comment) Initiated by:: LC Date initiated:: 12/17/14   Consult Status Consult Status: Follow-up Date: 12/17/14 Follow-up type: In-patient    Soyla DryerJoseph, Aastha Dayley 12/17/2014, 11:50 AM

## 2014-12-17 NOTE — Lactation Note (Signed)
This note was copied from the chart of Jocelyn Florestine AversDenise Bohac. Lactation Consultation Note  Baby fed better this feeding.  Mom did breast compression and I showed Dad how to squeeze the baby's cheeks to increase the vacuum.  Mom was able to express about 6 ml and it was fed back to the baby.  Parents desire to exclusively BF but will give formula if needed.  I will encourage feeding every 2 hours for the rest of the day and 3 hours over night.  Follow-up planned.  Patient Name: Jocelyn Griffin DGUYQ'IToday's Date: 12/17/2014     Maternal Data    Feeding Feeding Type: Breast Fed Length of feed: 20 min  LATCH Score/Interventions Latch: Grasps breast easily, tongue down, lips flanged, rhythmical sucking.  Audible Swallowing: A few with stimulation  Type of Nipple: Everted at rest and after stimulation  Comfort (Breast/Nipple): Filling, red/small blisters or bruises, mild/mod discomfort     Hold (Positioning): No assistance needed to correctly position infant at breast.  LATCH Score: 8  Lactation Tools Discussed/Used     Consult Status      Soyla DryerJoseph, Jocelyn Griffin 12/17/2014, 2:55 PM

## 2014-12-17 NOTE — Discharge Summary (Signed)
POSTOPERATIVE DISCHARGE SUMMARY:  Patient ID: Jocelyn Griffin MRN: [id number] DOB/AGE: 12-Mar-1991 24 y.o.  Admit date: 12/14/2014 Admission Diagnoses: Breech Presentation /Failed cephalic version at 36 wks / Active Labor   Discharge date:  12/17/2014 Discharge Diagnoses: S/P Primary C/S due to breech presentation on 12/14/2014        Prenatal history: G1P1001   EDC : 12/19/2014, by Last Menstrual Period  Has received prenatal care at [facility] since 11.[redacted] wks gestation. Primary provider : Marlinda Mike Prenatal course complicated by breech presentation / fetal arrhythmia / (+) GBS  Prenatal Labs: ABO, Rh: B POS (03/28 1215)  Antibody: NEG (03/28 1215) Rubella: Immune (09/22 0000)   RPR: Non Reactive (03/28 1215)  HBsAg: Negative (09/22 0000)  HIV: Non-reactive (09/22 0000)  GTT : Abnormal 1 hr GTT / Normal 3 hr GTT GBS: Positive (09/22 0000)   Medical / Surgical History :  Past medical history:  Past Medical History  Diagnosis Date  . Menorrhagia   . Oligomenorrhea   . Postcoital UTI   . Fetal arrhythmia affecting pregnancy, antepartum     documented PACS by cardiologist  . Asthma     last inhaler use a yr ago    Past surgical history:  Past Surgical History  Procedure Laterality Date  . Wisdom tooth extraction  01/17/11  . Cesarean section N/A 12/14/2014    Procedure: CESAREAN SECTION;  Surgeon: Noland Fordyce, MD;  Location: [facility];  Service: Obstetrics;  Laterality: N/A;     Allergies: Review of patient's allergies indicates no known allergies.   Intrapartum Course:  Admitted for scheduled cesarean delivery for breech presentation - see operative report for further details  Physical Exam:   VSS: Blood pressure 118/75, pulse 66, temperature 98 F (36.7 C), temperature source Oral, resp. rate 20, height  (1.626 m), weight 87.091 kg (192 lb), last menstrual period 03/14/2014, SpO2 99 %, currently breastfeeding.  LABS:  Recent Labs  12/14/14 1215 12/15/14 0550  WBC 7.1 11.3*  HGB 13.9 11.4*  PLT 359 311    Newborn Data Live born female  Birth Weight: 7 lb 13.6 oz (3560 g) APGAR: 9, 9  See operative report for further details  Home with mother.  Discharge Instructions:  Wound Care: keep clean and dry / remove honeycomb POD 5 Postpartum Instructions: [facility] discharge booklet - instructions reviewed Medications:    Medication List    TAKE these medications        ibuprofen 600 MG tablet  Commonly known as:  ADVIL,MOTRIN  Take 1 tablet (600 mg total) by mouth every 6 (six) hours.     lanolin Oint  Apply 1 application topically as needed (for breast care).     OVER THE COUNTER MEDICATION  Apply 1 application topically daily as needed. Anti fungal cream as needed for irritation under breast.     oxyCODONE-acetaminophen 5-325 MG per tablet  Commonly known as:  PERCOCET/ROXICET  Take 1 tablet by mouth every 4 (four) hours as needed (for pain scale 4-7).     prenatal multivitamin Tabs tablet  Take 1 tablet by mouth daily.     VENTOLIN HFA 108 (90 BASE) MCG/ACT inhaler  Generic drug:  albuterol  USE 2 PUFFS EVERY 4-6 HOURS AS NEEDED           Follow-up Information    Follow up with Marlinda Mike, CNM. Schedule an appointment as soon as possible for a visit in 6 weeks.   Specialty:  Obstetrics  and Gynecology   Contact information:   [address] [place] [id number] [phone]       Please follow up.   Why:  postpartum visit        Signed: Raelyn MoraAWSON, Lovel Suazo, M, MSN, CNM 12/17/2014, 9:16 AM

## 2014-12-17 NOTE — Discharge Instructions (Signed)
Breast Pumping Tips °If you are breastfeeding, there may be times when you cannot feed your baby directly. Returning to work or going on a trip are common examples. Pumping allows you to store breast milk and feed it to your baby later.  °You may not get much milk when you first start to pump. Your breasts should start to make more after a few days. If you pump at the times you usually feed your baby, you may be able to keep making enough milk to feed your baby without also using formula. The more often you pump, the more milk you will produce.  °WHEN SHOULD I PUMP?  °· You can begin to pump soon after delivery. However, some experts recommend waiting about 4 weeks before giving your infant a bottle to make sure breastfeeding is going well.  °· If you plan to return to work, begin pumping a few weeks before. This will help you develop techniques that work best for you. It also lets you build up a supply of breast milk.   °· When you are with your infant, feed on demand and pump after each feeding.   °· When you are away from your infant for several hours, pump for about 15 minutes every 2-3 hours. Pump both breasts at the same time if you can.   °· If your infant has a formula feeding, make sure to pump around the same time.     °· If you drink any alcohol, wait 2 hours before pumping.   °HOW DO I PREPARE TO PUMP? °Your let-down reflex is the natural reaction to stimulation that makes your breast milk flow. It is easier to stimulate this reflex when you are relaxed. Find relaxation techniques that work for you. If you have difficulty with your let-down reflex, try these methods:  °· Smell one of your infant's blankets or an item of clothing.   °· Look at a picture or video of your infant.   °· Sit in a quiet, private space.   °· Massage the breast you plan to pump.   °· Place soothing warmth on the breast.   °· Play relaxing music.   °WHAT ARE SOME GENERAL BREAST PUMPING TIPS? °· Wash your hands before you pump. You  do not need to wash your nipples or breasts. °· There are three ways to pump. °¨ You can use your hand to massage and compress your breast. °¨ You can use a handheld manual pump. °¨ You can use an electric pump.   °· Make sure the suction cup (flange) on the breast pump is the right size. Place the flange directly over the nipple. If it is the wrong size or placed the wrong way, it may be painful and cause nipple damage.   °· If pumping is uncomfortable, apply a small amount of purified or modified lanolin to your nipple and areola. °· If you are using an electric pump, adjust the speed and suction power to be more comfortable. °· If pumping is painful or if you are not getting very much milk, you may need a different type of pump. A lactation consultant can help you determine what type of pump to use.   °· Keep a full water bottle near you at all times. Drinking lots of fluid helps you make more milk.  °· You can store your milk to use later. Pumped breast milk can be stored in a sealable, sterile container or plastic bag. Label all stored breast milk with the date you pumped it. °¨ Milk can stay out at room temperature for up to 8 hours. °¨   You can store your milk in the refrigerator for up to 8 days. °¨ You can store your milk in the freezer for 3 months. Thaw frozen milk using warm water. Do not put it in the microwave. °· Do not smoke. Smoking can lower your milk supply and harm your infant. If you need help quitting, ask your health care provider to recommend a program.   °WHEN SHOULD I CALL MY HEALTH CARE PROVIDER OR A LACTATION CONSULTANT? °· You are having trouble pumping. °· You are concerned that you are not making enough milk. °· You have nipple pain, soreness, or redness. °· You want to use birth control. Birth control pills may lower your milk supply. Talk to your health care provider about your options. °Document Released: 02/22/2010 Document Revised: 09/09/2013 Document Reviewed:  06/27/2013 °Exit[employer]® Patient Information ©2015 [employer]. This information is not intended to replace advice given to you by your health care provider. Make sure you discuss any questions you have with your health care provider. ° °Nutrition for the New Mother  °A new mother needs good health and nutrition so she can have energy to take care of a new baby. Whether a mother breastfeeds or formula feeds the baby, it is important to have a well-balanced diet. Foods from all the food groups should be chosen to meet the new mother's energy needs and to give her the nutrients needed for repair and healing.  °A HEALTHY EATING PLAN °The My [employer]ram[employer] plan for Moms outlines what you should eat to help you and your baby stay healthy. The energy and amount of food you need depends on whether or not you are breastfeeding. If you are breastfeeding you will need more nutrients. If you choose not to breastfeed, your nutrition goal should be to return to a healthy weight. Limiting calories may be needed if you are not breastfeeding.  °HOME CARE INSTRUCTIONS  °· For a personal plan based on your unique needs, see your Registered Dietitian or visit www.mypyramid.gov. °· Eat a variety of foods. The plan below will help guide you. The following chart has a suggested daily meal plan from the [employer] for Moms. °· Eat a variety of fruits and vegetables. °· Eat more dark green and orange vegetables and cooked dried beans. °· Make half your grains whole grains. Choose whole instead of refined grains. °· Choose low-fat or lean meats and poultry. °· Choose low-fat or fat-free dairy products like milk, cheese, or yogurt. °Fruits °· Breastfeeding: 2 cups °· Non-Breastfeeding: 2 cups °· What Counts as a serving? °¨ 1 cup of fruit or juice. °¨ ½ cup dried fruit. °Vegetables °· Breastfeeding: 3 cups °· Non-Breastfeeding: 2 ½ cups °· What Counts as a serving? °¨ 1 cup raw or cooked vegetables. °¨ Juice or 2 cups raw leafy  vegetables. °Grains °· Breastfeeding: 8 oz °· Non-Breastfeeding: 6 oz °· What Counts as a serving? °¨ 1 slice bread. °¨ 1 oz ready-to-eat cereal. °¨ ½ cup cooked pasta, rice, or cereal. °Meat and Beans °· Breastfeeding: 6 ½ oz °· Non-Breastfeeding: 5 ½ oz °· What Counts as a serving? °¨ 1 oz lean meat, poultry, or fish °¨ ¼ cup cooked dry beans °¨ ½ oz nuts or 1 egg °¨ 1 tbs peanut butter °Milk °· Breastfeeding: 3 cups °· Non-Breastfeeding: 3 cups °· What Counts as a serving? °¨ 1 cup milk. °¨ 8 oz yogurt. °¨ 1 ½ oz cheese. °¨ 2 oz processed cheese. °TIPS FOR THE BREASTFEEDING MOM °· Rapid weight   loss is not suggested when you are breastfeeding. By simply breastfeeding, you will be able to lose the weight gained during your pregnancy. Your caregiver can keep track of your weight and tell you if your weight loss is appropriate. °· Be sure to drink fluids. You may notice that you are thirstier than usual. A suggestion is to drink a glass of water or other beverage whenever you breastfeed. °· Avoid alcohol as it can be passed into your breast milk. °· Limit caffeine drinks to no more than 2 to 3 cups per day. °· You may need to keep taking your prenatal vitamin while you are breastfeeding. Talk with your caregiver about taking a vitamin or supplement. °RETURING TO A HEALTHY WEIGHT °· The My [employer] Plan for Moms will help you return to a healthy weight. It will also provide the nutrients you need. °· You may need to limit "empty" calories. These include: °¨ High fat foods like fried foods, fatty meats, fast food, butter, and mayonnaise. °¨ High sugar foods like sodas, jelly, candy, and sweets. °· Be physically active. Include 30 minutes of exercise or more each day. Choose an activity you like such as walking, swimming, biking, or aerobics. Check with your caregiver before you start to exercise. °Document Released: 12/12/2007 Document Revised: 11/27/2011 Document Reviewed: 12/12/2007 °ExitCare® Patient Information  ©2015 [employer]. This information is not intended to replace advice given to you by your health care provider. Make sure you discuss any questions you have with your health care provider. °Postpartum Depression and Baby Blues °The postpartum period begins right after the birth of a baby. During this time, there is often a great amount of joy and excitement. It is also a time of many changes in the life of the parents. Regardless of how many times a mother gives birth, each child brings new challenges and dynamics to the family. It is not unusual to have feelings of excitement along with confusing shifts in moods, emotions, and thoughts. All mothers are at risk of developing postpartum depression or the "baby blues." These mood changes can occur right after giving birth, or they may occur many months after giving birth. The baby blues or postpartum depression can be mild or severe. Additionally, postpartum depression can go away rather quickly, or it can be a long-term condition.  °CAUSES °Raised hormone levels and the rapid drop in those levels are thought to be a main cause of postpartum depression and the baby blues. A number of hormones change during and after pregnancy. Estrogen and progesterone usually decrease right after the delivery of your baby. The levels of thyroid hormone and various cortisol steroids also rapidly drop. Other factors that play a role in these mood changes include major life events and genetics.  °RISK FACTORS °If you have any of the following risks for the baby blues or postpartum depression, know what symptoms to watch out for during the postpartum period. Risk factors that may increase the likelihood of getting the baby blues or postpartum depression include: °· Having a personal or family history of depression.   °· Having depression while being pregnant.   °· Having premenstrual mood issues or mood issues related to oral contraceptives. °· Having a lot of life stress.   °· Having  marital conflict.   °· Lacking a social support network.   °· Having a baby with special needs.   °· Having health problems, such as diabetes.   °SIGNS AND SYMPTOMS °Symptoms of baby blues include: °· Brief changes in mood, such as going   from extreme happiness to sadness. °· Decreased concentration.   °· Difficulty sleeping.   °· Crying spells, tearfulness.   °· Irritability.   °· Anxiety.   °Symptoms of postpartum depression typically begin within the first month after giving birth. These symptoms include: °· Difficulty sleeping or excessive sleepiness.   °· Marked weight loss.   °· Agitation.   °· Feelings of worthlessness.   °· Lack of interest in activity or food.   °Postpartum psychosis is a very serious condition and can be dangerous. Fortunately, it is rare. Displaying any of the following symptoms is cause for immediate medical attention. Symptoms of postpartum psychosis include:  °· Hallucinations and delusions.   °· Bizarre or disorganized behavior.   °· Confusion or disorientation.   °DIAGNOSIS  °A diagnosis is made by an evaluation of your symptoms. There are no medical or lab tests that lead to a diagnosis, but there are various questionnaires that a health care provider may use to identify those with the baby blues, postpartum depression, or psychosis. Often, a screening tool called the Edinburgh Postnatal Depression Scale is used to diagnose depression in the postpartum period.  °TREATMENT °The baby blues usually goes away on its own in 1-2 weeks. Social support is often all that is needed. You will be encouraged to get adequate sleep and rest. Occasionally, you may be given medicines to help you sleep.  °Postpartum depression requires treatment because it can last several months or longer if it is not treated. Treatment may include individual or group therapy, medicine, or both to address any social, physiological, and psychological factors that may play a role in the depression. Regular exercise, a  healthy diet, rest, and social support may also be strongly recommended.  °Postpartum psychosis is more serious and needs treatment right away. Hospitalization is often needed. °HOME CARE INSTRUCTIONS °· Get as much rest as you can. Nap when the baby sleeps.   °· Exercise regularly. Some women find yoga and walking to be beneficial.   °· Eat a balanced and nourishing diet.   °· Do little things that you enjoy. Have a cup of tea, take a bubble bath, read your favorite magazine, or listen to your favorite music. °· Avoid alcohol.   °· Ask for help with household chores, cooking, grocery shopping, or running errands as needed. Do not try to do everything.   °· Talk to people close to you about how you are feeling. Get support from your partner, family members, friends, or other new moms. °· Try to stay positive in how you think. Think about the things you are grateful for.   °· Do not spend a lot of time alone.   °· Only take over-the-counter or prescription medicine as directed by your health care provider. °· Keep all your postpartum appointments.   °· Let your health care provider know if you have any concerns.   °SEEK MEDICAL CARE IF: °You are having a reaction to or problems with your medicine. °SEEK IMMEDIATE MEDICAL CARE IF: °· You have suicidal feelings.   °· You think you may harm the baby or someone else. °MAKE SURE YOU: °· Understand these instructions. °· Will watch your condition. °· Will get help right away if you are not doing well or get worse. °Document Released: 06/08/2004 Document Revised: 09/09/2013 Document Reviewed: 06/16/2013 °Exit[employer]® Patient Information ©2015 [employer]. This information is not intended to replace advice given to you by your health care provider. Make sure you discuss any questions you have with your health care provider. °Breastfeeding and Mastitis °Mastitis is inflammation of the breast tissue. It can occur in women who   are breastfeeding. This can make breastfeeding  painful. Mastitis will sometimes go away on its own. Your health care provider will help determine if treatment is needed. °CAUSES °Mastitis is often associated with a blocked milk (lactiferous) duct. This can happen when too much milk builds up in the breast. Causes of excess milk in the breast can include: °· Poor latch-on. If your baby is not latched onto the breast properly, she or he may not empty your breast completely while breastfeeding. °· Allowing too much time to pass between feedings. °· Wearing a bra or other clothing that is too tight. This puts extra pressure on the lactiferous ducts so milk does not flow through them as it should. °Mastitis can also be caused by a bacterial infection. Bacteria may enter the breast tissue through cuts or openings in the skin. In women who are breastfeeding, this may occur because of cracked or irritated skin. Cracks in the skin are often caused when your baby does not latch on properly to the breast. °SIGNS AND SYMPTOMS °· Swelling, redness, tenderness, and pain in an area of the breast. °· Swelling of the glands under the arm on the same side. °· Fever may or may not accompany mastitis. °If an infection is allowed to progress, a collection of pus (abscess) may develop. °DIAGNOSIS  °Your health care provider can usually diagnose mastitis based on your symptoms and a physical exam. Tests may be done to help confirm the diagnosis. These may include: °· Removal of pus from the breast by applying pressure to the area. This pus can be examined in the lab to determine which bacteria are present. If an abscess has developed, the fluid in the abscess can be removed with a needle. This can also be used to confirm the diagnosis and determine the bacteria present. In most cases, pus will not be present. °· Blood tests to determine if your body is fighting a bacterial infection. °· Mammogram or ultrasound tests to rule out other problems or diseases. °TREATMENT  °Mastitis that  occurs with breastfeeding will sometimes go away on its own. Your health care provider may choose to wait 24 hours after first seeing you to decide whether a prescription medicine is needed. If your symptoms are worse after 24 hours, your health care provider will likely prescribe an antibiotic medicine to treat the mastitis. He or she will determine which bacteria are most likely causing the infection and will then select an appropriate antibiotic medicine. This is sometimes changed based on the results of tests performed to identify the bacteria, or if there is no response to the antibiotic medicine selected. Antibiotic medicines are usually given by mouth. You may also be given medicine for pain. °HOME CARE INSTRUCTIONS °· Only take over-the-counter or prescription medicines for pain, fever, or discomfort as directed by your health care provider. °· If your health care provider prescribed an antibiotic medicine, take the medicine as directed. Make sure you finish it even if you start to feel better. °· Do not wear a tight or underwire bra. Wear a soft, supportive bra. °· Increase your fluid intake, especially if you have a fever. °· Continue to empty the breast. Your health care provider can tell you whether this milk is safe for your infant or needs to be thrown out. You may be told to stop nursing until your health care provider thinks it is safe for your baby. Use a breast pump if you are advised to stop nursing. °· Keep your nipples   clean and dry. °· Empty the first breast completely before going to the other breast. If your baby is not emptying your breasts completely for some reason, use a breast pump to empty your breasts. °· If you go back to work, pump your breasts while at work to stay in time with your nursing schedule. °· Avoid allowing your breasts to become overly filled with milk (engorged). °SEEK MEDICAL CARE IF: °· You have pus-like discharge from the breast. °· Your symptoms do not improve with  the treatment prescribed by your health care provider within 2 days. °SEEK IMMEDIATE MEDICAL CARE IF: °· Your pain and swelling are getting worse. °· You have pain that is not controlled with medicine. °· You have a red line extending from the breast toward your armpit. °· You have a fever or persistent symptoms for more than 2-3 days. °· You have a fever and your symptoms suddenly get worse. °MAKE SURE YOU:  °· Understand these instructions. °· Will watch your condition. °· Will get help right away if you are not doing well or get worse. °Document Released: 12/30/2004 Document Revised: 09/09/2013 Document Reviewed: 04/10/2013 °[employer]® Patient Information ©2015 [employer]. This information is not intended to replace advice given to you by your health care provider. Make sure you discuss any questions you have with your health care provider. °Breastfeeding °Deciding to breastfeed is one of the best choices you can make for you and your baby. A change in hormones during pregnancy causes your breast tissue to grow and increases the number and size of your milk ducts. These hormones also allow proteins, sugars, and fats from your blood supply to make breast milk in your milk-producing glands. Hormones prevent breast milk from being released before your baby is born as well as prompt milk flow after birth. Once breastfeeding has begun, thoughts of your baby, as well as his or her sucking or crying, can stimulate the release of milk from your milk-producing glands.  °BENEFITS OF BREASTFEEDING °For Your Baby °· Your first milk (colostrum) helps your baby's digestive system function better.   °· There are antibodies in your milk that help your baby fight off infections.   °· Your baby has a lower incidence of asthma, allergies, and sudden infant death syndrome.   °· The nutrients in breast milk are better for your baby than infant formulas and are designed uniquely for your baby's needs.   °· Breast milk improves your  baby's brain development.   °· Your baby is less likely to develop other conditions, such as childhood obesity, asthma, or type 2 diabetes mellitus.   °For You  °· Breastfeeding helps to create a very special bond between you and your baby.   °· Breastfeeding is convenient. Breast milk is always available at the correct temperature and costs nothing.   °· Breastfeeding helps to burn calories and helps you lose the weight gained during pregnancy.   °· Breastfeeding makes your uterus contract to its prepregnancy size faster and slows bleeding (lochia) after you give birth.   °· Breastfeeding helps to lower your risk of developing type 2 diabetes mellitus, osteoporosis, and breast or ovarian cancer later in life. °SIGNS THAT YOUR BABY IS HUNGRY °Early Signs of Hunger  °· Increased alertness or activity. °· Stretching. °· Movement of the head from side to side. °· Movement of the head and opening of the mouth when the corner of the mouth or cheek is stroked (rooting). °· Increased sucking sounds, smacking lips, cooing, sighing, or squeaking. °· Hand-to-mouth movements. °· Increased sucking of   fingers or hands. °Late Signs of Hunger °· Fussing. °· Intermittent crying. °Extreme Signs of Hunger °Signs of extreme hunger will require calming and consoling before your baby will be able to breastfeed successfully. Do not wait for the following signs of extreme hunger to occur before you initiate breastfeeding:   °· Restlessness. °· A loud, strong cry. °·  Screaming. °BREASTFEEDING BASICS °Breastfeeding Initiation °· Find a comfortable place to sit or lie down, with your neck and back well supported. °· Place a pillow or rolled up blanket under your baby to bring him or her to the level of your breast (if you are seated). Nursing pillows are specially designed to help support your arms and your baby while you breastfeed. °· Make sure that your baby's abdomen is facing your abdomen.   °· Gently massage your breast. With your  fingertips, massage from your chest wall toward your nipple in a circular motion. This encourages milk flow. You may need to continue this action during the feeding if your milk flows slowly. °· Support your breast with 4 fingers underneath and your thumb above your nipple. Make sure your fingers are well away from your nipple and your baby's mouth.   °· Stroke your baby's lips gently with your finger or nipple.   °· When your baby's mouth is open wide enough, quickly bring your baby to your breast, placing your entire nipple and as much of the colored area around your nipple (areola) as possible into your baby's mouth.   °¨ More areola should be visible above your baby's upper lip than below the lower lip.   °¨ Your baby's tongue should be between his or her lower gum and your breast.   °· Ensure that your baby's mouth is correctly positioned around your nipple (latched). Your baby's lips should create a seal on your breast and be turned out (everted). °· It is common for your baby to suck about 2-3 minutes in order to start the flow of breast milk. °Latching °Teaching your baby how to latch on to your breast properly is very important. An improper latch can cause nipple pain and decreased milk supply for you and poor weight gain in your baby. Also, if your baby is not latched onto your nipple properly, he or she may swallow some air during feeding. This can make your baby fussy. Burping your baby when you switch breasts during the feeding can help to get rid of the air. However, teaching your baby to latch on properly is still the best way to prevent fussiness from swallowing air while breastfeeding. °Signs that your baby has successfully latched on to your nipple:    °· Silent tugging or silent sucking, without causing you pain.   °· Swallowing heard between every 3-4 sucks.   °·  Muscle movement above and in front of his or her ears while sucking.   °Signs that your baby has not successfully latched on to  nipple:  °· Sucking sounds or smacking sounds from your baby while breastfeeding. °· Nipple pain. °If you think your baby has not latched on correctly, slip your finger into the corner of your baby's mouth to break the suction and place it between your baby's gums. Attempt breastfeeding initiation again. °Signs of Successful Breastfeeding °Signs from your baby:   °· A gradual decrease in the number of sucks or complete cessation of sucking.   °· Falling asleep.   °· Relaxation of his or her body.   °· Retention of a small amount of milk in his or her mouth.   °· Letting go   of your breast by himself or herself. °Signs from you: °· Breasts that have increased in firmness, weight, and size 1-3 hours after feeding.   °· Breasts that are softer immediately after breastfeeding. °· Increased milk volume, as well as a change in milk consistency and color by the fifth day of breastfeeding.   °· Nipples that are not sore, cracked, or bleeding. °Signs That Your Baby is Getting Enough Milk °· Wetting at least 3 diapers in a 24-hour period. The urine should be clear and pale yellow by age 5 days. °· At least 3 stools in a 24-hour period by age 5 days. The stool should be soft and yellow. °· At least 3 stools in a 24-hour period by age 7 days. The stool should be seedy and yellow. °· No loss of weight greater than 10% of birth weight during the first 3 days of age. °· Average weight gain of 4-7 ounces (113-198 g) per week after age 4 days. °· Consistent daily weight gain by age 5 days, without weight loss after the age of 2 weeks. °After a feeding, your baby may spit up a small amount. This is common. °BREASTFEEDING FREQUENCY AND DURATION °Frequent feeding will help you make more milk and can prevent sore nipples and breast engorgement. Breastfeed when you feel the need to reduce the fullness of your breasts or when your baby shows signs of hunger. This is called "breastfeeding on demand." Avoid introducing a pacifier to your  baby while you are working to establish breastfeeding (the first 4-6 weeks after your baby is born). After this time you may choose to use a pacifier. Research has shown that pacifier use during the first year of a baby's life decreases the risk of sudden infant death syndrome (SIDS). °Allow your baby to feed on each breast as long as he or she wants. Breastfeed until your baby is finished feeding. When your baby unlatches or falls asleep while feeding from the first breast, offer the second breast. Because newborns are often sleepy in the first few weeks of life, you may need to awaken your baby to get him or her to feed. °Breastfeeding times will vary from baby to baby. However, the following rules can serve as a guide to help you ensure that your baby is properly fed: °· Newborns (babies 4 weeks of age or younger) may breastfeed every 1-3 hours. °· Newborns should not go longer than 3 hours during the day or 5 hours during the night without breastfeeding. °· You should breastfeed your baby a minimum of 8 times in a 24-hour period until you begin to introduce solid foods to your baby at around 6 months of age. °BREAST MILK PUMPING °Pumping and storing breast milk allows you to ensure that your baby is exclusively fed your breast milk, even at times when you are unable to breastfeed. This is especially important if you are going back to work while you are still breastfeeding or when you are not able to be present during feedings. Your lactation consultant can give you guidelines on how long it is safe to store breast milk.  °A breast pump is a machine that allows you to pump milk from your breast into a sterile bottle. The pumped breast milk can then be stored in a refrigerator or freezer. Some breast pumps are operated by hand, while others use electricity. Ask your lactation consultant which type will work best for you. Breast pumps can be purchased, but some hospitals and breastfeeding support groups   lease  breast pumps on a monthly basis. A lactation consultant can teach you how to hand express breast milk, if you prefer not to use a pump.  °CARING FOR YOUR BREASTS WHILE YOU BREASTFEED °Nipples can become dry, cracked, and sore while breastfeeding. The following recommendations can help keep your breasts moisturized and healthy: °· Avoid using soap on your nipples.   °· Wear a supportive bra. Although not required, special nursing bras and tank tops are designed to allow access to your breasts for breastfeeding without taking off your entire bra or top. Avoid wearing underwire-style bras or extremely tight bras. °· Air dry your nipples for 3-4 minutes after each feeding.   °· Use only cotton bra pads to absorb leaked breast milk. Leaking of breast milk between feedings is normal.   °· Use lanolin on your nipples after breastfeeding. Lanolin helps to maintain your skin's normal moisture barrier. If you use pure lanolin, you do not need to wash it off before feeding your baby again. Pure lanolin is not toxic to your baby. You may also hand express a few drops of breast milk and gently massage that milk into your nipples and allow the milk to air dry. °In the first few weeks after giving birth, some women experience extremely full breasts (engorgement). Engorgement can make your breasts feel heavy, warm, and tender to the touch. Engorgement peaks within 3-5 days after you give birth. The following recommendations can help ease engorgement: °· Completely empty your breasts while breastfeeding or pumping. You may want to start by applying warm, moist heat (in the shower or with warm water-soaked hand towels) just before feeding or pumping. This increases circulation and helps the milk flow. If your baby does not completely empty your breasts while breastfeeding, pump any extra milk after he or she is finished. °· Wear a snug bra (nursing or regular) or tank top for 1-2 days to signal your body to slightly decrease milk  production. °· Apply ice packs to your breasts, unless this is too uncomfortable for you. °· Make sure that your baby is latched on and positioned properly while breastfeeding. °If engorgement persists after 48 hours of following these recommendations, contact your health care provider or a lactation consultant. °OVERALL HEALTH CARE RECOMMENDATIONS WHILE BREASTFEEDING °· Eat healthy foods. Alternate between meals and snacks, eating 3 of each per day. Because what you eat affects your breast milk, some of the foods may make your baby more irritable than usual. Avoid eating these foods if you are sure that they are negatively affecting your baby. °· Drink milk, fruit juice, and water to satisfy your thirst (about 10 glasses a day).   °· Rest often, relax, and continue to take your prenatal vitamins to prevent fatigue, stress, and anemia. °· Continue breast self-awareness checks. °· Avoid chewing and smoking tobacco. °· Avoid alcohol and drug use. °Some medicines that may be harmful to your baby can pass through breast milk. It is important to ask your health care provider before taking any medicine, including all over-the-counter and prescription medicine as well as vitamin and herbal supplements. °It is possible to become pregnant while breastfeeding. If birth control is desired, ask your health care provider about options that will be safe for your baby. °SEEK MEDICAL CARE IF:  °· You feel like you want to stop breastfeeding or have become frustrated with breastfeeding. °· You have painful breasts or nipples. °· Your nipples are cracked or bleeding. °· Your breasts are red, tender, or warm. °· You have   a swollen area on either breast. °· You have a fever or chills. °· You have nausea or vomiting. °· You have drainage other than breast milk from your nipples. °· Your breasts do not become full before feedings by the fifth day after you give birth. °· You feel sad and depressed. °· Your baby is too sleepy to eat  well. °· Your baby is having trouble sleeping.   °· Your baby is wetting less than 3 diapers in a 24-hour period. °· Your baby has less than 3 stools in a 24-hour period. °· Your baby's skin or the white part of his or her eyes becomes yellow.   °· Your baby is not gaining weight by 5 days of age. °SEEK IMMEDIATE MEDICAL CARE IF:  °· Your baby is overly tired (lethargic) and does not want to wake up and feed. °· Your baby develops an unexplained fever. °Document Released: 09/04/2005 Document Revised: 09/09/2013 Document Reviewed: 02/26/2013 °[employer]® Patient Information ©2015 [employer]. This information is not intended to replace advice given to you by your health care provider. Make sure you discuss any questions you have with your health care provider. ° °

## 2014-12-18 ENCOUNTER — Ambulatory Visit: Payer: Self-pay

## 2014-12-18 NOTE — Lactation Note (Signed)
This note was copied from the chart of Jocelyn Griffin. Lactation Consultation Note  Patient Name: Jocelyn Florestine AversDenise Galambos ZOXWR'UToday's Date: 12/18/2014 Reason for consult: Follow-up assessment;Infant weight loss Baby 90 hours of life. Called to room to assess latch. Baby latched deeply, suckling rhythmically with a few swallows noted. Demonstrated to mom how to flange lower lip and tuck baby in closer. Mom reports increased comfort. Asked mom to remove baby from breast, after breaking seal, and assessed mom's nipple. Mom's nipple is slightly pinched, but mom reports that she is not having pain like earlier. Enc mom to maintain a deep latch, and to call for OP appointment if baby continues to slip to tip of nipple. Enc mom again to pump and provide EBM supplementation after each BF.   Maternal Data    Feeding Feeding Type: Breast Fed Length of feed: 5 min  LATCH Score/Interventions Latch: Grasps breast easily, tongue down, lips flanged, rhythmical sucking. Intervention(s): Adjust position;Assist with latch;Breast compression  Audible Swallowing: A few with stimulation Intervention(s): Skin to skin;Hand expression Intervention(s): Skin to skin  Type of Nipple: Everted at rest and after stimulation  Comfort (Breast/Nipple): Filling, red/small blisters or bruises, mild/mod discomfort  Problem noted: Mild/Moderate discomfort Interventions (Mild/moderate discomfort): Post-pump;Comfort gels;Hand expression  Hold (Positioning): Assistance needed to correctly position infant at breast and maintain latch. Intervention(s): Breastfeeding basics reviewed;Support Pillows;Position options;Skin to skin  LATCH Score: 7  Lactation Tools Discussed/Used Tools: Pump Breast pump type: Double-Electric Breast Pump   Consult Status Consult Status: Complete    [name], Abdirizak Richison 12/18/2014, 10:28 AM

## 2014-12-18 NOTE — Lactation Note (Addendum)
This note was copied from the chart of Jocelyn Griffin. Lactation Consultation Note  Patient Name: Jocelyn Griffin WUJWJ'XToday's Date: 12/18/2014 Reason for consult: Follow-up assessment;Infant weight loss Baby 88 hours of life. Mom reports baby is latching better now, and she is only experiencing some discomfort at beginning of BF, discussed that this discomfort is normal, but pain beyond the first 15-20 seconds is not. Enc mom to latch baby deeply and maintain deep latch throughout feeding. Baby nursed within the last hour, so enc mom [facility] at [facility] feeding. Plan is for mom to offer baby breast first at each feeding--with cues and at least every 3 hours. Mom will supplement with EBM after breastfeeding. Discussed ways of knowing baby getting enough at breast/supplementation, and referred mom to Baby and Me booklet for number of diapers to expect by day of life and EBM storage guidelines. Enc mom to continue post-pumping after BF and giving baby what she pumps until her milk fully comes in, baby is nursing well at the breast, and baby is gaining weight. Mom has personal DEBP at home.  Mom's breasts are beginning to fill, and she does have some tight/knotty areas. Enc mom to massage breasts prior to nursing, and to hand express some to soften breast prior to nursing so baby can achieve a deep latch. Mom has comfort gels. Discussed with mom that if her nipples continue to be sore, there is probably an issue with the baby's latch. Discussed engorgement prevention/treatment. Mom aware of OP/BFSG and LC phone line assistance after D/C. Enc mom to call for assistance as needed after D/C.   Maternal Data    Feeding Feeding Type: Breast Fed Length of feed: 40 min  LATCH Score/Interventions Latch: Grasps breast easily, tongue down, lips flanged, rhythmical sucking. Intervention(s): Adjust position;Assist with latch;Breast massage;Breast compression  Audible Swallowing: A few with  stimulation Intervention(s): Hand expression Intervention(s): Skin to skin  Type of Nipple: Everted at rest and after stimulation  Comfort (Breast/Nipple): Filling, red/small blisters or bruises, mild/mod discomfort  Problem noted: Mild/Moderate discomfort Interventions (Mild/moderate discomfort): Comfort gels  Hold (Positioning): No assistance needed to correctly position infant at breast.  LATCH Score: 8  Lactation Tools Discussed/Used Breast pump type: Double-Electric Breast Pump   Consult Status Consult Status: PRN    Jocelyn Griffin, Jocelyn Griffin 12/18/2014, 8:57 AM

## 2017-01-19 LAB — OB RESULTS CONSOLE GC/CHLAMYDIA
Chlamydia: NEGATIVE
GC PROBE AMP, GENITAL: NEGATIVE

## 2017-01-19 LAB — OB RESULTS CONSOLE ANTIBODY SCREEN: Antibody Screen: NEGATIVE

## 2017-01-19 LAB — OB RESULTS CONSOLE HIV ANTIBODY (ROUTINE TESTING): HIV: NONREACTIVE

## 2017-01-19 LAB — OB RESULTS CONSOLE ABO/RH: RH Type: POSITIVE

## 2017-01-19 LAB — OB RESULTS CONSOLE RPR: RPR: NONREACTIVE

## 2017-01-19 LAB — OB RESULTS CONSOLE HEPATITIS B SURFACE ANTIGEN: Hepatitis B Surface Ag: NEGATIVE

## 2017-01-19 LAB — OB RESULTS CONSOLE RUBELLA ANTIBODY, IGM: Rubella: IMMUNE

## 2017-03-06 DIAGNOSIS — Z361 Encounter for antenatal screening for raised alphafetoprotein level: Secondary | ICD-10-CM | POA: Diagnosis not present

## 2017-03-23 DIAGNOSIS — Z3482 Encounter for supervision of other normal pregnancy, second trimester: Secondary | ICD-10-CM | POA: Diagnosis not present

## 2017-03-23 DIAGNOSIS — Z363 Encounter for antenatal screening for malformations: Secondary | ICD-10-CM | POA: Diagnosis not present

## 2017-04-20 DIAGNOSIS — Z362 Encounter for other antenatal screening follow-up: Secondary | ICD-10-CM | POA: Diagnosis not present

## 2017-04-20 DIAGNOSIS — Z3482 Encounter for supervision of other normal pregnancy, second trimester: Secondary | ICD-10-CM | POA: Diagnosis not present

## 2017-04-21 ENCOUNTER — Encounter (HOSPITAL_COMMUNITY): Payer: Self-pay | Admitting: Family Medicine

## 2017-04-21 ENCOUNTER — Ambulatory Visit (HOSPITAL_COMMUNITY)
Admission: EM | Admit: 2017-04-21 | Discharge: 2017-04-21 | Disposition: A | Discharge status: Home / Self Care | Payer: BLUE CROSS/BLUE SHIELD | Attending: Internal Medicine | Admitting: Internal Medicine

## 2017-04-21 DIAGNOSIS — O26892 Other specified pregnancy related conditions, second trimester: Secondary | ICD-10-CM | POA: Insufficient documentation

## 2017-04-21 DIAGNOSIS — O99512 Diseases of the respiratory system complicating pregnancy, second trimester: Secondary | ICD-10-CM | POA: Diagnosis not present

## 2017-04-21 DIAGNOSIS — R197 Diarrhea, unspecified: Secondary | ICD-10-CM | POA: Insufficient documentation

## 2017-04-21 DIAGNOSIS — Z3A23 23 weeks gestation of pregnancy: Secondary | ICD-10-CM | POA: Diagnosis not present

## 2017-04-21 DIAGNOSIS — J45909 Unspecified asthma, uncomplicated: Secondary | ICD-10-CM | POA: Diagnosis not present

## 2017-04-21 LAB — POCT URINALYSIS DIP (DEVICE)
BILIRUBIN URINE: NEGATIVE
Glucose, UA: NEGATIVE mg/dL
HGB URINE DIPSTICK: NEGATIVE
Ketones, ur: NEGATIVE mg/dL
LEUKOCYTES UA: NEGATIVE
NITRITE: NEGATIVE
Protein, ur: 30 mg/dL — AB
Urobilinogen, UA: 0.2 mg/dL (ref 0.0–1.0)
pH: 5.5 (ref 5.0–8.0)

## 2017-04-21 NOTE — ED Triage Notes (Signed)
Pt here for diarrhea 3 to 4 times a day since Tuesday. sts that she was seen by her OB yesterday and told to take pro biotics. sts that she is eating and drinking normally.

## 2017-04-21 NOTE — Discharge Instructions (Signed)
Your urine was negative for UTI. Culture will be sent. You will be contacted with any positive results that requires further treatment. Bring back stool sample for testing. Continue to stay hydrated, in take probiotics as directed. Monitor for worsening of symptoms, increased abdominal pain, nausea, vomiting, blood in stool, vaginal spotting, follow up with OB for further evaluation.

## 2017-04-21 NOTE — ED Provider Notes (Addendum)
CSN: [id number]     Arrival date & time 04/21/17  1844 History   None    Chief Complaint  Patient presents with  . Diarrhea   (Consider location/radiation/quality/duration/timing/severity/associated sxs/prior Treatment) 26 year old female who is [redacted] weeks pregnant comes in for 4-5 day history of diarrhea. She was seen by her OB yesterday and was told to take probiotics. She came in because diarrhea has increased in frequency and wanted to test stool for bacteria. She had ultrasound done yesterday and stated fetus is doing well. She continues to feel fetal movement. Some abdominal cramping, and having 3-4 episodes of diarrhea a day. Denies N/V. Has some dysuria today, but denies frequency out of normal, hematuria. She states OB collected urine yesterday, but was unsure if it was tested for UTI. She denies blood in stool, blood in urine. Denies vaginal spotting/bleeding. Denies fever, chills, night sweats. Recent travel, but family members without similar symptoms. Denies recent antibiotic use. She is able to eat and drink normally, and able to keep hydrated by mouth.       Past Medical History:  Diagnosis Date  . Asthma    last inhaler use a yr ago  . Fetal arrhythmia affecting pregnancy, antepartum    documented PACS by cardiologist  . Menorrhagia   . Oligomenorrhea   . Postcoital UTI    Past Surgical History:  Procedure Laterality Date  . CESAREAN SECTION N/A 12/14/2014   Procedure: CESAREAN SECTION;  Surgeon: Aloha Gell, MD;  Location: [facility];  Service: Obstetrics;  Laterality: N/A;  . WISDOM TOOTH EXTRACTION  01/17/11   Family History  Problem Relation Age of Onset  . Hypertension Father   . Diabetes Maternal Grandmother   . Hypertension Maternal Grandmother   . Colon cancer Maternal Grandmother 74  . Cervical cancer Paternal Grandmother 53  . Diabetes Maternal Aunt    Social History  Substance Use Topics  . Smoking status: Never Smoker  . Smokeless tobacco: Never Used  .  Alcohol use No   OB History    Gravida Para Term Preterm AB Living   _0 SAB TAB Ectopic Multiple Live Births         0 1     Review of Systems  Reason unable to perform ROS: See HPI as above.    Allergies  Patient has no known allergies.  Home Medications   Prior to Admission medications   Medication Sig Start Date End Date Taking? Authorizing Provider  ibuprofen (ADVIL,MOTRIN) 600 MG tablet Take 1 tablet (600 mg total) by mouth every 6 (six) hours. 12/17/14   Laury Deep, CNM  lanolin OINT Apply 1 application topically as needed (for breast care). 12/17/14   Laury Deep, CNM  OVER THE COUNTER MEDICATION Apply 1 application topically daily as needed. Anti fungal cream as needed for irritation under breast.    [provider]  oxyCODONE-acetaminophen (PERCOCET/ROXICET) 5-325 MG per tablet Take 1 tablet by mouth every 4 (four) hours as needed (for pain scale 4-7). 12/17/14   Laury Deep, CNM  Prenatal Vit-Fe Fumarate-FA (PRENATAL MULTIVITAMIN) TABS tablet Take 1 tablet by mouth daily.    [provider]  VENTOLIN HFA 108 (90 BASE) MCG/ACT inhaler USE 2 PUFFS EVERY 4-6 HOURS AS NEEDED Patient taking differently: Use 2 puffs every 4 to 6 hours as needed for shortness of breath 06/09/14   Susy Frizzle, MD   Meds Ordered and Administered this Visit  Medications - No data to display  BP 124/75   Pulse (!) 103   Temp 98 F (36.7 C)   Resp 18   SpO2 98%  No data found.   Physical Exam  Constitutional: She is oriented to person, place, and time. She appears well-developed and well-nourished. No distress.  HENT:  Head: Normocephalic and atraumatic.  Eyes: Pupils are equal, round, and reactive to light. Conjunctivae are normal.  Cardiovascular: Normal rate, regular rhythm and normal heart sounds.  Exam reveals no gallop and no friction rub.   No murmur heard. Pulmonary/Chest: Effort normal and breath sounds normal. She has no wheezes. She  has no rales.  Abdominal: Soft. Bowel sounds are normal. She exhibits no mass. There is no tenderness. There is no rebound, no guarding and no CVA tenderness.  Neurological: She is alert and oriented to person, place, and time.  Skin: Skin is warm and dry.  Psychiatric: She has a normal mood and affect. Her behavior is normal. Judgment normal.    Urgent Care Course     Procedures (including critical care time)  Labs Review Labs Reviewed  POCT URINALYSIS DIP (DEVICE) - Abnormal; Notable for the following:       Result Value   Protein, ur 30 (*)    All other components within normal limits  URINE CULTURE    Imaging Review No results found.       MDM   1. Diarrhea, unspecified type    Discussed with patient possible causes of diarrhea. No alarming symptoms today. Discussed lab results with patient, negative for UTI. Stool sample kit given to patient, will bring back tomorrow for testing. Patient will be contacted with positive results that requires further treatment. Patient to keep hydrated. Continue probiotics as directed. Monitor for worsening of symptoms, fever, blood in stool/urine, vaginal bleeding, to follow up at the ED or with OB for further evaluation.    Ok Edwards, PA-C 04/21/17 2040    Cathlean Sauer V, PA-C 04/21/17 [phone]

## 2017-04-22 ENCOUNTER — Telehealth (HOSPITAL_COMMUNITY): Payer: Self-pay | Admitting: Family Medicine

## 2017-04-22 NOTE — Telephone Encounter (Signed)
Pt returned to the clinic today to drop of stool sample for PCR testing.

## 2017-04-23 ENCOUNTER — Telehealth (HOSPITAL_COMMUNITY): Payer: Self-pay | Admitting: Physician Assistant

## 2017-04-23 LAB — URINE CULTURE

## 2017-04-23 LAB — GASTROINTESTINAL PANEL BY PCR, STOOL (REPLACES STOOL CULTURE)
ASTROVIRUS: NOT DETECTED
Adenovirus F40/41: NOT DETECTED
CYCLOSPORA CAYETANENSIS: NOT DETECTED
Campylobacter species: NOT DETECTED
Cryptosporidium: NOT DETECTED
ENTAMOEBA HISTOLYTICA: NOT DETECTED
ENTEROAGGREGATIVE E COLI (EAEC): DETECTED — AB
Enteropathogenic E coli (EPEC): NOT DETECTED
Enterotoxigenic E coli (ETEC): NOT DETECTED
GIARDIA LAMBLIA: NOT DETECTED
NOROVIRUS GI/GII: NOT DETECTED
Plesimonas shigelloides: NOT DETECTED
Rotavirus A: NOT DETECTED
SALMONELLA SPECIES: NOT DETECTED
SHIGELLA/ENTEROINVASIVE E COLI (EIEC): NOT DETECTED
Sapovirus (I, II, IV, and V): NOT DETECTED
Shiga like toxin producing E coli (STEC): NOT DETECTED
VIBRIO CHOLERAE: NOT DETECTED
VIBRIO SPECIES: NOT DETECTED
Yersinia enterocolitica: NOT DETECTED

## 2017-04-23 NOTE — ED Notes (Signed)
+   [facility], Dr Dayton ScrapeMurray aware of results

## 2017-04-23 NOTE — Telephone Encounter (Signed)
Stool culture check panel showed enteroaggregative ecoli. No antibiotic treatments needed. Patient to continue symptomatic treatment as before. Push fluids. Follow up with OB. Recheck as needed.

## 2017-05-17 DIAGNOSIS — Z3689 Encounter for other specified antenatal screening: Secondary | ICD-10-CM | POA: Diagnosis not present

## 2017-05-17 DIAGNOSIS — Z3482 Encounter for supervision of other normal pregnancy, second trimester: Secondary | ICD-10-CM | POA: Diagnosis not present

## 2017-06-06 DIAGNOSIS — Z23 Encounter for immunization: Secondary | ICD-10-CM | POA: Diagnosis not present

## 2017-06-06 DIAGNOSIS — Z3483 Encounter for supervision of other normal pregnancy, third trimester: Secondary | ICD-10-CM | POA: Diagnosis not present

## 2017-06-06 DIAGNOSIS — Z3689 Encounter for other specified antenatal screening: Secondary | ICD-10-CM | POA: Diagnosis not present

## 2017-06-20 DIAGNOSIS — Z3483 Encounter for supervision of other normal pregnancy, third trimester: Secondary | ICD-10-CM | POA: Diagnosis not present

## 2017-06-20 DIAGNOSIS — Z23 Encounter for immunization: Secondary | ICD-10-CM | POA: Diagnosis not present

## 2017-06-22 DIAGNOSIS — M5432 Sciatica, left side: Secondary | ICD-10-CM | POA: Diagnosis not present

## 2017-06-22 DIAGNOSIS — M5431 Sciatica, right side: Secondary | ICD-10-CM | POA: Diagnosis not present

## 2017-06-22 DIAGNOSIS — M9903 Segmental and somatic dysfunction of lumbar region: Secondary | ICD-10-CM | POA: Diagnosis not present

## 2017-06-22 DIAGNOSIS — M9905 Segmental and somatic dysfunction of pelvic region: Secondary | ICD-10-CM | POA: Diagnosis not present

## 2017-06-25 DIAGNOSIS — M9903 Segmental and somatic dysfunction of lumbar region: Secondary | ICD-10-CM | POA: Diagnosis not present

## 2017-06-25 DIAGNOSIS — M5432 Sciatica, left side: Secondary | ICD-10-CM | POA: Diagnosis not present

## 2017-06-25 DIAGNOSIS — M5431 Sciatica, right side: Secondary | ICD-10-CM | POA: Diagnosis not present

## 2017-06-25 DIAGNOSIS — M9905 Segmental and somatic dysfunction of pelvic region: Secondary | ICD-10-CM | POA: Diagnosis not present

## 2017-07-06 DIAGNOSIS — Z3483 Encounter for supervision of other normal pregnancy, third trimester: Secondary | ICD-10-CM | POA: Diagnosis not present

## 2017-07-09 DIAGNOSIS — M5431 Sciatica, right side: Secondary | ICD-10-CM | POA: Diagnosis not present

## 2017-07-09 DIAGNOSIS — M9905 Segmental and somatic dysfunction of pelvic region: Secondary | ICD-10-CM | POA: Diagnosis not present

## 2017-07-09 DIAGNOSIS — M9903 Segmental and somatic dysfunction of lumbar region: Secondary | ICD-10-CM | POA: Diagnosis not present

## 2017-07-09 DIAGNOSIS — M5432 Sciatica, left side: Secondary | ICD-10-CM | POA: Diagnosis not present

## 2017-07-13 DIAGNOSIS — M5432 Sciatica, left side: Secondary | ICD-10-CM | POA: Diagnosis not present

## 2017-07-13 DIAGNOSIS — M9903 Segmental and somatic dysfunction of lumbar region: Secondary | ICD-10-CM | POA: Diagnosis not present

## 2017-07-13 DIAGNOSIS — M5431 Sciatica, right side: Secondary | ICD-10-CM | POA: Diagnosis not present

## 2017-07-13 DIAGNOSIS — M9905 Segmental and somatic dysfunction of pelvic region: Secondary | ICD-10-CM | POA: Diagnosis not present

## 2017-07-16 DIAGNOSIS — M9903 Segmental and somatic dysfunction of lumbar region: Secondary | ICD-10-CM | POA: Diagnosis not present

## 2017-07-16 DIAGNOSIS — M5431 Sciatica, right side: Secondary | ICD-10-CM | POA: Diagnosis not present

## 2017-07-16 DIAGNOSIS — M5432 Sciatica, left side: Secondary | ICD-10-CM | POA: Diagnosis not present

## 2017-07-16 DIAGNOSIS — M9905 Segmental and somatic dysfunction of pelvic region: Secondary | ICD-10-CM | POA: Diagnosis not present

## 2017-07-18 ENCOUNTER — Encounter (HOSPITAL_COMMUNITY): Payer: Self-pay

## 2017-07-18 DIAGNOSIS — Z3A35 35 weeks gestation of pregnancy: Secondary | ICD-10-CM | POA: Diagnosis not present

## 2017-07-18 DIAGNOSIS — O350XX0 Maternal care for (suspected) central nervous system malformation in fetus, not applicable or unspecified: Secondary | ICD-10-CM | POA: Diagnosis not present

## 2017-07-18 DIAGNOSIS — Z3685 Encounter for antenatal screening for Streptococcus B: Secondary | ICD-10-CM | POA: Diagnosis not present

## 2017-07-18 LAB — OB RESULTS CONSOLE GBS: STREP GROUP B AG: POSITIVE

## 2017-07-19 DIAGNOSIS — M5431 Sciatica, right side: Secondary | ICD-10-CM | POA: Diagnosis not present

## 2017-07-19 DIAGNOSIS — M5432 Sciatica, left side: Secondary | ICD-10-CM | POA: Diagnosis not present

## 2017-07-19 DIAGNOSIS — M9903 Segmental and somatic dysfunction of lumbar region: Secondary | ICD-10-CM | POA: Diagnosis not present

## 2017-07-19 DIAGNOSIS — M9905 Segmental and somatic dysfunction of pelvic region: Secondary | ICD-10-CM | POA: Diagnosis not present

## 2017-07-20 ENCOUNTER — Other Ambulatory Visit (HOSPITAL_COMMUNITY): Payer: Self-pay | Admitting: Obstetrics

## 2017-07-20 DIAGNOSIS — Z3A36 36 weeks gestation of pregnancy: Secondary | ICD-10-CM

## 2017-07-20 DIAGNOSIS — Z3689 Encounter for other specified antenatal screening: Secondary | ICD-10-CM

## 2017-07-20 DIAGNOSIS — O3509X0 Maternal care for (suspected) other central nervous system malformation or damage in fetus, not applicable or unspecified: Secondary | ICD-10-CM

## 2017-07-20 DIAGNOSIS — O350XX0 Maternal care for (suspected) central nervous system malformation in fetus, not applicable or unspecified: Secondary | ICD-10-CM

## 2017-07-23 DIAGNOSIS — M9905 Segmental and somatic dysfunction of pelvic region: Secondary | ICD-10-CM | POA: Diagnosis not present

## 2017-07-23 DIAGNOSIS — M5431 Sciatica, right side: Secondary | ICD-10-CM | POA: Diagnosis not present

## 2017-07-23 DIAGNOSIS — M9903 Segmental and somatic dysfunction of lumbar region: Secondary | ICD-10-CM | POA: Diagnosis not present

## 2017-07-23 DIAGNOSIS — M5432 Sciatica, left side: Secondary | ICD-10-CM | POA: Diagnosis not present

## 2017-07-24 ENCOUNTER — Ambulatory Visit (HOSPITAL_COMMUNITY)
Admission: RE | Admit: 2017-07-24 | Discharge: 2017-07-24 | Disposition: A | Discharge status: Home / Self Care | Payer: BLUE CROSS/BLUE SHIELD | Source: Ambulatory Visit | Attending: Obstetrics | Admitting: Obstetrics

## 2017-07-24 ENCOUNTER — Encounter (HOSPITAL_COMMUNITY): Payer: Self-pay | Admitting: *Deleted

## 2017-07-24 DIAGNOSIS — Z3689 Encounter for other specified antenatal screening: Secondary | ICD-10-CM | POA: Insufficient documentation

## 2017-07-24 DIAGNOSIS — O350XX0 Maternal care for (suspected) central nervous system malformation in fetus, not applicable or unspecified: Secondary | ICD-10-CM | POA: Diagnosis not present

## 2017-07-24 DIAGNOSIS — O3509X0 Maternal care for (suspected) other central nervous system malformation or damage in fetus, not applicable or unspecified: Secondary | ICD-10-CM

## 2017-07-24 DIAGNOSIS — Q898 Other specified congenital malformations: Secondary | ICD-10-CM | POA: Diagnosis not present

## 2017-07-24 DIAGNOSIS — Z3A36 36 weeks gestation of pregnancy: Secondary | ICD-10-CM | POA: Diagnosis not present

## 2017-07-25 DIAGNOSIS — O350XX0 Maternal care for (suspected) central nervous system malformation in fetus, not applicable or unspecified: Secondary | ICD-10-CM | POA: Diagnosis not present

## 2017-07-25 DIAGNOSIS — Z3A36 36 weeks gestation of pregnancy: Secondary | ICD-10-CM | POA: Diagnosis not present

## 2017-07-26 ENCOUNTER — Other Ambulatory Visit: Payer: Self-pay | Admitting: Obstetrics

## 2017-07-26 ENCOUNTER — Other Ambulatory Visit (HOSPITAL_COMMUNITY): Payer: BLUE CROSS/BLUE SHIELD

## 2017-07-26 DIAGNOSIS — M5431 Sciatica, right side: Secondary | ICD-10-CM | POA: Diagnosis not present

## 2017-07-26 DIAGNOSIS — M9905 Segmental and somatic dysfunction of pelvic region: Secondary | ICD-10-CM | POA: Diagnosis not present

## 2017-07-26 DIAGNOSIS — M9903 Segmental and somatic dysfunction of lumbar region: Secondary | ICD-10-CM | POA: Diagnosis not present

## 2017-07-26 DIAGNOSIS — M5432 Sciatica, left side: Secondary | ICD-10-CM | POA: Diagnosis not present

## 2017-07-27 ENCOUNTER — Encounter (HOSPITAL_COMMUNITY): Payer: Self-pay

## 2017-08-01 DIAGNOSIS — M9905 Segmental and somatic dysfunction of pelvic region: Secondary | ICD-10-CM | POA: Diagnosis not present

## 2017-08-01 DIAGNOSIS — M5432 Sciatica, left side: Secondary | ICD-10-CM | POA: Diagnosis not present

## 2017-08-01 DIAGNOSIS — M5431 Sciatica, right side: Secondary | ICD-10-CM | POA: Diagnosis not present

## 2017-08-01 DIAGNOSIS — M9903 Segmental and somatic dysfunction of lumbar region: Secondary | ICD-10-CM | POA: Diagnosis not present

## 2017-08-02 DIAGNOSIS — Z3A37 37 weeks gestation of pregnancy: Secondary | ICD-10-CM | POA: Diagnosis not present

## 2017-08-02 DIAGNOSIS — O350XX0 Maternal care for (suspected) central nervous system malformation in fetus, not applicable or unspecified: Secondary | ICD-10-CM | POA: Diagnosis not present

## 2017-08-06 NOTE — Patient Instructions (Signed)
Vernie MurdersDenise A Janco  08/06/2017   Your procedure is scheduled on:  08/13/2017  [facility] through [facility] of [facility] at 0530 AM.  Pick up the phone at the desk and dial [phone]  Call this number if you have problems the morning of surgery:[phone]  Remember:  YOU NEED TO COME TO [facility] ON Sunday AFTERNOON BETWEEN NOON AND 5 FOR BLOOD WORK. THEY WILL BE EXPECTING YOU.  TELL THE STAFF AT THE DESK YOU ARE HERE FOR BLOOD WORK FOR YOUR CESAREAN ON Monday.   Do not eat food:After Midnight.  Do not drink clear liquids: After Midnight.  Take these medicines the morning of surgery with A SIP OF WATER: bring your inhaler with you please   Do not wear jewelry, make-up or nail polish.  Do not wear lotions, powders, or perfumes. Do not wear deodorant.  Do not shave 48 hours prior to surgery.  Do not bring valuables to the hospital.  [facility] is not   responsible for any belongings or valuables brought to the hospital.  Contacts, dentures or bridgework may not be worn into surgery.  Leave suitcase in the car. After surgery it may be brought to your room.  For patients admitted to the hospital, checkout time is 11:00 AM the day of              discharge.    N/A   Please read over the following fact sheets that you were given:   Surgical Site Infection Prevention

## 2017-08-07 ENCOUNTER — Encounter (HOSPITAL_COMMUNITY)
Admission: RE | Admit: 2017-08-07 | Discharge: 2017-08-07 | Disposition: A | Discharge status: Home / Self Care | Payer: BLUE CROSS/BLUE SHIELD | Source: Ambulatory Visit | Attending: Obstetrics | Admitting: Obstetrics

## 2017-08-07 DIAGNOSIS — Z3A38 38 weeks gestation of pregnancy: Secondary | ICD-10-CM | POA: Diagnosis not present

## 2017-08-07 DIAGNOSIS — O350XX0 Maternal care for (suspected) central nervous system malformation in fetus, not applicable or unspecified: Secondary | ICD-10-CM | POA: Diagnosis not present

## 2017-08-12 LAB — CBC
HEMATOCRIT: 37.9 % (ref 36.0–46.0)
Hemoglobin: 12.2 g/dL (ref 12.0–15.0)
MCH: 27.5 pg (ref 26.0–34.0)
MCHC: 32.2 g/dL (ref 30.0–36.0)
MCV: 85.6 fL (ref 78.0–100.0)
Platelets: 355 10*3/uL (ref 150–400)
RBC: 4.43 MIL/uL (ref 3.87–5.11)
RDW: 14.7 % (ref 11.5–15.5)
WBC: 6.7 10*3/uL (ref 4.0–10.5)

## 2017-08-12 LAB — TYPE AND SCREEN
ABO/RH(D): B POS
Antibody Screen: NEGATIVE

## 2017-08-12 NOTE — H&P (Signed)
Jocelyn MurdersDenise A Griffin is a 26 y.o. G2P1001 at 6223w2d presenting for RCS. Pt notes no contractions . Good fetal movement, No vaginal bleeding, not leaking fluid.  [facility] 7 wks - Dated by 7 wk u/s, unsure LMP  - PCS for breech, initially planning TOLAC but given ventriculomegaly pt opting for scheduled RCS - Ventriculomegaly, first noticed at 35 wks with growth u/s, no clear etiology, s/p MFM consult, pt did have viral GI illness at 13 wks pregnancy which was not further evaluated. B/l ventricles at 19mm at 35 wks.  - Declined genetic screening - GBS pos - growth 35 wks, 6'4, 76%,    Prenatal Transfer Tool  Maternal Diabetes: No Genetic Screening: Declined Maternal Ultrasounds/Referrals: Abnormal:  Findings:   Other: Fetal Ultrasounds or other Referrals:  Referred to Materal Fetal Medicine  Maternal Substance Abuse:  No Significant Maternal Medications:  None Significant Maternal Lab Results: None     OB History    Gravida Para Term Preterm AB Living   2 1 1     1    SAB TAB Ectopic Multiple Live Births         0 1     Past Medical History:  Diagnosis Date  . Asthma    last inhaler use a yr ago  . Fetal arrhythmia affecting pregnancy, antepartum    documented PACS by cardiologist  . Menorrhagia   . Oligomenorrhea   . Postcoital UTI    Past Surgical History:  Procedure Laterality Date  . CESAREAN SECTION N/A 12/14/2014   Procedure: CESAREAN SECTION;  Surgeon: Jocelyn FordyceKelly Taiwo Fish, MD;  Location: [facility];  Service: Obstetrics;  Laterality: N/A;  . WISDOM TOOTH EXTRACTION  01/17/11   Family History: family history includes Cervical cancer (age of onset: 4078) in her paternal grandmother; Colon cancer (age of onset: 5974) in her maternal grandmother; Diabetes in her maternal aunt and maternal grandmother; Hypertension in her father and maternal grandmother. Social History:  reports that  has never smoked. she has never used smokeless tobacco. She reports that she does not  drink alcohol or use drugs.  Review of Systems - Negative except discomfort of pregnancy      Physical Exam:   Vitals:   08/13/17 0634 08/13/17 0635  BP:  120/77  Pulse:  (!) 109  Temp: 98.3 F (36.8 C)     Gen: well appearing, no distress Back: no CVAT Abd: gravid, NT, no RUQ pain LE: trace edema, equal bilaterally, non-tender   Prenatal labs: ABO, Rh: --/--/B POS (11/25 1422) Antibody: NEG (11/25 1422) Rubella: Immune (05/04 0000) RPR: Nonreactive (05/04 0000)  HBsAg: Negative (05/04 0000)  HIV: Non-reactive (05/04 0000)  GBS: Positive (10/31 0000)  1 hr Glucola 105  Genetic screening declined Anatomy US normal  CBC    Component Value Date/Time   WBC 6.7 08/12/2017 1422   RBC 4.43 08/12/2017 1422   HGB 12.2 08/12/2017 1422   HGB 14.8 11/18/2013 1114   HCT 37.9 08/12/2017 1422   PLT 355 08/12/2017 1422   MCV 85.6 08/12/2017 1422   MCH 27.5 08/12/2017 1422   MCHC 32.2 08/12/2017 1422   RDW 14.7 08/12/2017 1422     Assessment/Plan: 26 y.o. G2P1001 at 6523w2d RCS, R/B reviewed with pt.  NICU/ peds assessment for b/l ventriculomegaly GBS pos.    Lendon ColonelKelly A Maizey Griffin 08/12/2017, 10:16 PM   Lendon ColonelKelly A Myda Griffin 08/13/2017 7:13 AM

## 2017-08-13 ENCOUNTER — Inpatient Hospital Stay (HOSPITAL_COMMUNITY)
Admission: RE | Admit: 2017-08-13 | Discharge: 2017-08-16 | DRG: 788 | Disposition: A | Discharge status: Home / Self Care | Payer: BLUE CROSS/BLUE SHIELD | Source: Ambulatory Visit | Attending: Obstetrics | Admitting: Obstetrics

## 2017-08-13 ENCOUNTER — Inpatient Hospital Stay (HOSPITAL_COMMUNITY): Payer: BLUE CROSS/BLUE SHIELD

## 2017-08-13 ENCOUNTER — Encounter (HOSPITAL_COMMUNITY): Payer: Self-pay

## 2017-08-13 ENCOUNTER — Encounter (HOSPITAL_COMMUNITY)
Admission: RE | Disposition: A | Discharge status: Home / Self Care | Payer: Self-pay | Source: Ambulatory Visit | Attending: Obstetrics

## 2017-08-13 DIAGNOSIS — O34211 Maternal care for low transverse scar from previous cesarean delivery: Secondary | ICD-10-CM | POA: Diagnosis not present

## 2017-08-13 DIAGNOSIS — Q02 Microcephaly: Secondary | ICD-10-CM | POA: Diagnosis not present

## 2017-08-13 DIAGNOSIS — Z412 Encounter for routine and ritual male circumcision: Secondary | ICD-10-CM | POA: Diagnosis not present

## 2017-08-13 DIAGNOSIS — O321XX0 Maternal care for breech presentation, not applicable or unspecified: Secondary | ICD-10-CM | POA: Diagnosis not present

## 2017-08-13 DIAGNOSIS — Z98891 History of uterine scar from previous surgery: Secondary | ICD-10-CM

## 2017-08-13 DIAGNOSIS — J45909 Unspecified asthma, uncomplicated: Secondary | ICD-10-CM | POA: Diagnosis present

## 2017-08-13 DIAGNOSIS — Q620 Congenital hydronephrosis: Secondary | ICD-10-CM | POA: Diagnosis not present

## 2017-08-13 DIAGNOSIS — O9902 Anemia complicating childbirth: Secondary | ICD-10-CM | POA: Diagnosis not present

## 2017-08-13 DIAGNOSIS — D509 Iron deficiency anemia, unspecified: Secondary | ICD-10-CM | POA: Diagnosis not present

## 2017-08-13 DIAGNOSIS — Z3A39 39 weeks gestation of pregnancy: Secondary | ICD-10-CM

## 2017-08-13 DIAGNOSIS — Z23 Encounter for immunization: Secondary | ICD-10-CM | POA: Diagnosis not present

## 2017-08-13 DIAGNOSIS — O99824 Streptococcus B carrier state complicating childbirth: Secondary | ICD-10-CM | POA: Diagnosis present

## 2017-08-13 DIAGNOSIS — Q040 Congenital malformations of corpus callosum: Secondary | ICD-10-CM | POA: Diagnosis not present

## 2017-08-13 DIAGNOSIS — G9389 Other specified disorders of brain: Secondary | ICD-10-CM | POA: Diagnosis not present

## 2017-08-13 DIAGNOSIS — O34219 Maternal care for unspecified type scar from previous cesarean delivery: Secondary | ICD-10-CM | POA: Diagnosis not present

## 2017-08-13 DIAGNOSIS — O9952 Diseases of the respiratory system complicating childbirth: Secondary | ICD-10-CM | POA: Diagnosis not present

## 2017-08-13 DIAGNOSIS — R29898 Other symptoms and signs involving the musculoskeletal system: Secondary | ICD-10-CM | POA: Diagnosis not present

## 2017-08-13 LAB — RPR: RPR Ser Ql: NONREACTIVE

## 2017-08-13 SURGERY — Surgical Case
Anesthesia: Spinal | Site: Abdomen | Wound class: Clean Contaminated

## 2017-08-13 MED ORDER — LACTATED RINGERS IV SOLN
INTRAVENOUS | Status: DC | PRN
  Administered 2017-08-13: 08:00:00 via INTRAVENOUS

## 2017-08-13 MED ORDER — SENNOSIDES-DOCUSATE SODIUM 8.6-50 MG PO TABS
2.0000 | ORAL_TABLET | ORAL | Status: DC
  Administered 2017-08-14 – 2017-08-15 (×3): 2 via ORAL
  Filled 2017-08-13 (×3): qty 2

## 2017-08-13 MED ORDER — KETOROLAC TROMETHAMINE 30 MG/ML IJ SOLN
INTRAMUSCULAR | Status: AC
  Administered 2017-08-13: 30 mg via INTRAMUSCULAR
  Filled 2017-08-13: qty 1

## 2017-08-13 MED ORDER — DEXAMETHASONE SODIUM PHOSPHATE 4 MG/ML IJ SOLN
INTRAMUSCULAR | Status: AC
  Filled 2017-08-13: qty 1

## 2017-08-13 MED ORDER — ONDANSETRON HCL 4 MG/2ML IJ SOLN
INTRAMUSCULAR | Status: DC | PRN
  Administered 2017-08-13: 4 mg via INTRAVENOUS

## 2017-08-13 MED ORDER — DIPHENHYDRAMINE HCL 50 MG/ML IJ SOLN: 12.5000 mg | INTRAMUSCULAR | Status: DC | PRN

## 2017-08-13 MED ORDER — PHENYLEPHRINE 8 MG IN D5W 100 ML (0.08MG/ML) PREMIX OPTIME
INJECTION | INTRAVENOUS | Status: AC
  Filled 2017-08-13: qty 100

## 2017-08-13 MED ORDER — KETOROLAC TROMETHAMINE 30 MG/ML IJ SOLN: 30.0000 mg | Freq: Four times a day (QID) | INTRAMUSCULAR | Status: DC | PRN

## 2017-08-13 MED ORDER — MORPHINE SULFATE (PF) 0.5 MG/ML IJ SOLN
INTRAMUSCULAR | Status: AC
  Filled 2017-08-13: qty 10

## 2017-08-13 MED ORDER — ERYTHROMYCIN 5 MG/GM OP OINT
TOPICAL_OINTMENT | OPHTHALMIC | Status: AC
  Filled 2017-08-13: qty 1

## 2017-08-13 MED ORDER — FENTANYL CITRATE (PF) 100 MCG/2ML IJ SOLN
INTRAMUSCULAR | Status: AC
  Filled 2017-08-13: qty 2

## 2017-08-13 MED ORDER — LACTATED RINGERS IV SOLN
INTRAVENOUS | Status: DC
  Administered 2017-08-13: 22:00:00 via INTRAVENOUS

## 2017-08-13 MED ORDER — DEXAMETHASONE SODIUM PHOSPHATE 4 MG/ML IJ SOLN
INTRAMUSCULAR | Status: DC | PRN
  Administered 2017-08-13: 4 mg via INTRAVENOUS

## 2017-08-13 MED ORDER — SCOPOLAMINE 1 MG/3DAYS TD PT72: 1.0000 | MEDICATED_PATCH | Freq: Once | TRANSDERMAL | Status: DC

## 2017-08-13 MED ORDER — IBUPROFEN 600 MG PO TABS
600.0000 mg | ORAL_TABLET | Freq: Four times a day (QID) | ORAL | Status: DC
  Administered 2017-08-13 – 2017-08-16 (×11): 600 mg via ORAL
  Filled 2017-08-13 (×11): qty 1

## 2017-08-13 MED ORDER — DIPHENHYDRAMINE HCL 25 MG PO CAPS: 25.0000 mg | ORAL_CAPSULE | ORAL | Status: DC | PRN

## 2017-08-13 MED ORDER — PRENATAL MULTIVITAMIN CH
1.0000 | ORAL_TABLET | Freq: Every day | ORAL | Status: DC
  Administered 2017-08-14 – 2017-08-16 (×3): 1 via ORAL
  Filled 2017-08-13 (×2): qty 1

## 2017-08-13 MED ORDER — MENTHOL 3 MG MT LOZG: 1.0000 | LOZENGE | OROMUCOSAL | Status: DC | PRN

## 2017-08-13 MED ORDER — ACETAMINOPHEN 325 MG PO TABS
650.0000 mg | ORAL_TABLET | ORAL | Status: DC | PRN
Start: 2017-08-13 — End: 2017-08-16
  Administered 2017-08-15 – 2017-08-16 (×2): 650 mg via ORAL
  Filled 2017-08-13 (×2): qty 2

## 2017-08-13 MED ORDER — KETOROLAC TROMETHAMINE 30 MG/ML IJ SOLN
30.0000 mg | Freq: Four times a day (QID) | INTRAMUSCULAR | Status: DC | PRN
  Administered 2017-08-13: 30 mg via INTRAMUSCULAR

## 2017-08-13 MED ORDER — OXYCODONE-ACETAMINOPHEN 5-325 MG PO TABS
1.0000 | ORAL_TABLET | ORAL | Status: DC | PRN
  Administered 2017-08-14: 1 via ORAL
  Filled 2017-08-13 (×4): qty 1

## 2017-08-13 MED ORDER — COCONUT OIL OIL
1.0000 "application " | TOPICAL_OIL | Status: DC | PRN
  Administered 2017-08-14: 1 via TOPICAL
  Filled 2017-08-13: qty 120

## 2017-08-13 MED ORDER — LACTATED RINGERS IV SOLN
INTRAVENOUS | Status: DC
  Administered 2017-08-13 (×2): via INTRAVENOUS

## 2017-08-13 MED ORDER — SIMETHICONE 80 MG PO CHEW
80.0000 mg | CHEWABLE_TABLET | ORAL | Status: DC
  Administered 2017-08-14 – 2017-08-15 (×3): 80 mg via ORAL
  Filled 2017-08-13 (×3): qty 1

## 2017-08-13 MED ORDER — ACETAMINOPHEN 500 MG PO TABS
1000.0000 mg | ORAL_TABLET | Freq: Four times a day (QID) | ORAL | Status: DC
  Administered 2017-08-13 – 2017-08-14 (×3): 1000 mg via ORAL
  Filled 2017-08-13 (×3): qty 2

## 2017-08-13 MED ORDER — BUPIVACAINE HCL (PF) 0.25 % IJ SOLN
INTRAMUSCULAR | Status: AC
Start: 2017-08-13 — End: 2017-08-13
  Filled 2017-08-13: qty 30

## 2017-08-13 MED ORDER — ONDANSETRON HCL 4 MG/2ML IJ SOLN: 4.0000 mg | Freq: Three times a day (TID) | INTRAMUSCULAR | Status: DC | PRN

## 2017-08-13 MED ORDER — SIMETHICONE 80 MG PO CHEW
80.0000 mg | CHEWABLE_TABLET | Freq: Three times a day (TID) | ORAL | Status: DC
  Administered 2017-08-13 – 2017-08-16 (×8): 80 mg via ORAL
  Filled 2017-08-13 (×9): qty 1

## 2017-08-13 MED ORDER — OXYCODONE-ACETAMINOPHEN 5-325 MG PO TABS
2.0000 | ORAL_TABLET | ORAL | Status: DC | PRN
  Administered 2017-08-14 – 2017-08-16 (×3): 2 via ORAL
  Filled 2017-08-13 (×2): qty 2

## 2017-08-13 MED ORDER — BUPIVACAINE HCL (PF) 0.25 % IJ SOLN
INTRAMUSCULAR | Status: DC | PRN
  Administered 2017-08-13: 30 mL

## 2017-08-13 MED ORDER — VITAMIN K1 1 MG/0.5ML IJ SOLN
INTRAMUSCULAR | Status: AC
  Filled 2017-08-13: qty 0.5

## 2017-08-13 MED ORDER — OXYTOCIN 10 UNIT/ML IJ SOLN
INTRAMUSCULAR | Status: DC | PRN
  Administered 2017-08-13: 40 [IU] via INTRAVENOUS

## 2017-08-13 MED ORDER — SIMETHICONE 80 MG PO CHEW: 80.0000 mg | CHEWABLE_TABLET | ORAL | Status: DC | PRN

## 2017-08-13 MED ORDER — TRIAMCINOLONE ACETONIDE 40 MG/ML IJ SUSP
INTRAMUSCULAR | Status: DC | PRN
  Administered 2017-08-13: 40 mg via INTRADERMAL

## 2017-08-13 MED ORDER — FENTANYL CITRATE (PF) 100 MCG/2ML IJ SOLN: 25.0000 ug | INTRAMUSCULAR | Status: DC | PRN

## 2017-08-13 MED ORDER — MEPERIDINE HCL 25 MG/ML IJ SOLN: 6.2500 mg | INTRAMUSCULAR | Status: DC | PRN

## 2017-08-13 MED ORDER — OXYTOCIN 10 UNIT/ML IJ SOLN
INTRAMUSCULAR | Status: AC
  Filled 2017-08-13: qty 4

## 2017-08-13 MED ORDER — NALBUPHINE HCL 10 MG/ML IJ SOLN: 5.0000 mg | Freq: Once | INTRAMUSCULAR | Status: DC | PRN

## 2017-08-13 MED ORDER — DIBUCAINE 1 % RE OINT: 1.0000 "application " | TOPICAL_OINTMENT | RECTAL | Status: DC | PRN

## 2017-08-13 MED ORDER — ONDANSETRON HCL 4 MG/2ML IJ SOLN
INTRAMUSCULAR | Status: AC
  Filled 2017-08-13: qty 2

## 2017-08-13 MED ORDER — METOCLOPRAMIDE HCL 5 MG/ML IJ SOLN: 10.0000 mg | Freq: Once | INTRAMUSCULAR | Status: DC | PRN

## 2017-08-13 MED ORDER — TETANUS-DIPHTH-ACELL PERTUSSIS 5-2.5-18.5 LF-MCG/0.5 IM SUSP: 0.5000 mL | Freq: Once | INTRAMUSCULAR | Status: DC

## 2017-08-13 MED ORDER — BUPIVACAINE IN DEXTROSE 0.75-8.25 % IT SOLN
INTRATHECAL | Status: DC | PRN
  Administered 2017-08-13: 1.5 mL via INTRATHECAL

## 2017-08-13 MED ORDER — MORPHINE SULFATE (PF) 0.5 MG/ML IJ SOLN
INTRAMUSCULAR | Status: DC | PRN
  Administered 2017-08-13: .2 mg via INTRATHECAL

## 2017-08-13 MED ORDER — NALBUPHINE HCL 10 MG/ML IJ SOLN
5.0000 mg | INTRAMUSCULAR | Status: DC | PRN
  Administered 2017-08-13: 5 mg via INTRAVENOUS
  Filled 2017-08-13: qty 1

## 2017-08-13 MED ORDER — NALOXONE HCL 0.4 MG/ML IJ SOLN: 0.4000 mg | INTRAMUSCULAR | Status: DC | PRN

## 2017-08-13 MED ORDER — FENTANYL CITRATE (PF) 100 MCG/2ML IJ SOLN
INTRAMUSCULAR | Status: DC | PRN
  Administered 2017-08-13: 10 ug via INTRATHECAL

## 2017-08-13 MED ORDER — SODIUM CHLORIDE 0.9% FLUSH: 3.0000 mL | INTRAVENOUS | Status: DC | PRN

## 2017-08-13 MED ORDER — DIPHENHYDRAMINE HCL 25 MG PO CAPS
25.0000 mg | ORAL_CAPSULE | Freq: Four times a day (QID) | ORAL | Status: DC | PRN
  Administered 2017-08-14: 25 mg via ORAL
  Filled 2017-08-13: qty 1

## 2017-08-13 MED ORDER — ZOLPIDEM TARTRATE 5 MG PO TABS: 5.0000 mg | ORAL_TABLET | Freq: Every evening | ORAL | Status: DC | PRN

## 2017-08-13 MED ORDER — NALBUPHINE HCL 10 MG/ML IJ SOLN: 5.0000 mg | INTRAMUSCULAR | Status: DC | PRN

## 2017-08-13 MED ORDER — SODIUM CHLORIDE 0.9 % IR SOLN
Status: DC | PRN
  Administered 2017-08-13: 1

## 2017-08-13 MED ORDER — DEXTROSE 5 % IV SOLN: 1.0000 ug/kg/h | INTRAVENOUS | Status: DC | PRN

## 2017-08-13 MED ORDER — PHENYLEPHRINE 8 MG IN D5W 100 ML (0.08MG/ML) PREMIX OPTIME
INJECTION | INTRAVENOUS | Status: DC | PRN
  Administered 2017-08-13: 60 ug/min via INTRAVENOUS

## 2017-08-13 MED ORDER — WITCH HAZEL-GLYCERIN EX PADS: 1.0000 "application " | MEDICATED_PAD | CUTANEOUS | Status: DC | PRN

## 2017-08-13 MED ORDER — CEFAZOLIN SODIUM-DEXTROSE 2-4 GM/100ML-% IV SOLN
2.0000 g | INTRAVENOUS | Status: AC
  Administered 2017-08-13: 2 g via INTRAVENOUS
  Filled 2017-08-13: qty 100

## 2017-08-13 MED ORDER — OXYTOCIN 40 UNITS IN LACTATED RINGERS INFUSION - SIMPLE MED: 2.5000 [IU]/h | INTRAVENOUS | Status: DC

## 2017-08-13 MED ORDER — TRIAMCINOLONE ACETONIDE 40 MG/ML IJ SUSP
40.0000 mg | Freq: Once | INTRAMUSCULAR | Status: DC
  Filled 2017-08-13: qty 1

## 2017-08-13 MED ORDER — TRIAMCINOLONE ACETONIDE 40 MG/ML IJ SUSP: 40.0000 mg | Freq: Once | INTRAMUSCULAR | Status: DC

## 2017-08-13 MED ORDER — LACTATED RINGERS IV SOLN: INTRAVENOUS | Status: DC

## 2017-08-13 SURGICAL SUPPLY — 36 items
BENZOIN TINCTURE PRP APPL 2/3 (GAUZE/BANDAGES/DRESSINGS) ×2 IMPLANT
CHLORAPREP W/TINT 26ML (MISCELLANEOUS) ×2 IMPLANT
CLAMP CORD UMBIL (MISCELLANEOUS) IMPLANT
CLOSURE STERI STRIP 1/2 X4 (GAUZE/BANDAGES/DRESSINGS) ×2 IMPLANT
CLOTH BEACON ORANGE TIMEOUT ST (SAFETY) ×2 IMPLANT
CONTAINER PREFILL 10% NBF 15ML (MISCELLANEOUS) IMPLANT
DRSG OPSITE POSTOP 4X10 (GAUZE/BANDAGES/DRESSINGS) ×2 IMPLANT
ELECT REM PT RETURN 9FT ADLT (ELECTROSURGICAL) ×2
ELECTRODE REM PT RTRN 9FT ADLT (ELECTROSURGICAL) ×1 IMPLANT
EXTRACTOR VACUUM M CUP 4 TUBE (SUCTIONS) IMPLANT
GLOVE BIO SURGEON STRL SZ 6.5 (GLOVE) ×2 IMPLANT
GLOVE BIOGEL PI IND STRL 7.0 (GLOVE) ×2 IMPLANT
GLOVE BIOGEL PI INDICATOR 7.0 (GLOVE) ×2
GOWN STRL REUS W/TWL LRG LVL3 (GOWN DISPOSABLE) ×4 IMPLANT
KIT ABG SYR 3ML LUER SLIP (SYRINGE) IMPLANT
NDL SAFETY ECLIPSE 18X1.5 (NEEDLE) ×1 IMPLANT
NEEDLE HYPO 18GX1.5 SHARP (NEEDLE) ×1
NEEDLE HYPO 22GX1.5 SAFETY (NEEDLE) ×2 IMPLANT
NEEDLE HYPO 25X5/8 SAFETYGLIDE (NEEDLE) IMPLANT
NS IRRIG 1000ML POUR BTL (IV SOLUTION) ×2 IMPLANT
PACK C SECTION WH (CUSTOM PROCEDURE TRAY) ×2 IMPLANT
PAD OB MATERNITY 4.3X12.25 (PERSONAL CARE ITEMS) ×2 IMPLANT
PENCIL SMOKE EVAC W/HOLSTER (ELECTROSURGICAL) ×2 IMPLANT
STRIP CLOSURE SKIN 1/2X4 (GAUZE/BANDAGES/DRESSINGS) IMPLANT
SUT MON AB 4-0 PS1 27 (SUTURE) ×2 IMPLANT
SUT PLAIN 0 NONE (SUTURE) IMPLANT
SUT PLAIN 2 0 XLH (SUTURE) ×2 IMPLANT
SUT VIC AB 0 CT1 36 (SUTURE) ×4 IMPLANT
SUT VIC AB 0 CTX 36 (SUTURE) ×3
SUT VIC AB 0 CTX36XBRD ANBCTRL (SUTURE) ×3 IMPLANT
SUT VIC AB 2-0 CT1 27 (SUTURE) ×1
SUT VIC AB 2-0 CT1 TAPERPNT 27 (SUTURE) ×1 IMPLANT
SYR 5ML LL (SYRINGE) ×2 IMPLANT
SYR CONTROL 10ML LL (SYRINGE) ×2 IMPLANT
TOWEL OR 17X24 6PK STRL BLUE (TOWEL DISPOSABLE) ×2 IMPLANT
TRAY FOLEY BAG SILVER LF 14FR (SET/KITS/TRAYS/PACK) IMPLANT

## 2017-08-13 NOTE — Op Note (Signed)
08/13/2017  8:39 AM  PATIENT:  Jocelyn Griffin  26 y.o. female  PRE-OPERATIVE DIAGNOSIS:  Previous Cesarean Section, keloid scar  POST-OPERATIVE DIAGNOSIS:  Repeat LTCS with 2 layer closure and Pfanensteil scar revision  PROCEDURE:  Procedure(s) with comments: Repeat CESAREAN SECTION (N/A) - EDD: 08/17/17 Time ok per Izora Ribasoley.  2 layer closure, LTCS Scar revision  SURGEON:  Surgeon(s) and Role:    * Noland FordyceFogleman, Starlena Beil, MD - Primary  PHYSICIAN ASSISTANT:   ASSISTANTS: Fredric MareBailey, CNM   ANESTHESIA:   spinal  EBL:  765 mL   BLOOD ADMINISTERED:none  DRAINS: Urinary Catheter (Foley)   LOCAL MEDICATIONS USED:  MARCAINE    and OTHER 40 unit Kenalog mixed with 30cc marcaine  SPECIMEN:  Source of Specimen:  placenta  DISPOSITION OF SPECIMEN:  L&D  COUNTS:  YES  TOURNIQUET:  * No tourniquets in log *  DICTATION: .Note written in EPIC  PLAN OF CARE: Admit to inpatient   PATIENT DISPOSITION:  PACU - hemodynamically stable.   Delay start of Pharmacological VTE agent (>24hrs) due to surgical blood loss or risk of bleeding: yes     Findings:  @BABYSEXEBC @ infant,  APGAR (1 MIN): 9   APGAR (5 MINS): 9   APGAR (10 MINS):   Normal uterus, tubes and ovaries, normal placenta. 3VC, clear amniotic fluid  EBL: per anasthesia, about 650 cc Antibiotics:   2g Ancef Complications: none  Indications: This is a 26 y.o. year-old, G2P1  At 7430w3d admitted for RCS. Risks benefits and alternatives of the procedure were discussed with the patient who agreed to proceed  Procedure:  After informed consent was obtained the patient was taken to the operating room where spinal anesthesia was initiated.  She was prepped and draped in the normal sterile fashion in dorsal supine position with a leftward tilt.  A foley catheter was in place.  A Pfannenstiel skin incision was made 2 cm above the pubic symphysis in the midline with the scalpel.  Dissection was carried down with the Bovie cautery until the  fascia was reached. The fascia was incised in the midline. The incision was extended laterally with the Mayo scissors. The inferior aspect of the fascial incision was grasped with the Coker clamps, elevated up and the underlying rectus muscles were dissected off sharply.. The superior aspect of the fascial incision was grasped with the Coker clamps elevated up and the underlying rectus muscles were dissected off sharply. Adhesions were noted. Care was taken. A combination of sharp and cautery dissection were used. During the superior dissection, peritoneum was entered. The peritoneal incision was extended superiorly and inferiorly with good visualization of the bladder. The bladder blade was inserted and palpation was done to assess the fetal position and the location of the uterine vessels. The lower segment of the uterus was incised sharply with the scalpel and extended  bluntly in the cephalo-caudal fashion. The infant was grasped, brought to the incision,  rotated and the infant was delivered with fundal pressure. Nuchal, body and leg cord all delivered through with rotation. The cord was clamped and cut after 1 minute delay. The infant was handed off to the waiting pediatrician. The placenta was expressed. The uterus was exteriorized. The uterus was cleared of all clots and debris. The uterine incision was repaired with 0 Vicryl in a running locked fashion.  A second layer of the same suture was used in an imbricating fashion to obtain excellent hemostasis.  The uterus was then returned to the abdomen, the  gutters were cleared of all clots and debris. The uterine incision was reinspected and found to be hemostatic after several additional figure of 8 sutures were placed. The peritoneum was grasped and closed with 2-0 Vicryl in a running fashion. The cut muscle edges and the underside of the fascia were inspected and found to be hemostatic. The fascia was closed with 0 Vicryl in 2 halves. The subcutaneous tissue  was irrigated. Scarpa's layer was closed with a 2-0 plain gut suture. Kenalog/ marcaine mixture placed. The skin was closed with a 4-0 Monocryl in a single layer. The patient tolerated the procedure well. Sponge lap and needle counts were correct x3 and patient was taken to the recovery room in a stable condition.  Lendon ColonelKelly A Shawntez Dickison 08/13/2017 8:41 AM

## 2017-08-13 NOTE — Anesthesia Postprocedure Evaluation (Signed)
Anesthesia Post Note  Patient: Vernie Murdersenise A Danzer  Procedure(s) Performed: Repeat CESAREAN SECTION (N/A Abdomen)     Patient location during evaluation: Mother Baby Anesthesia Type: Spinal Level of consciousness: oriented and awake and alert Pain management: pain level controlled Vital Signs Assessment: post-procedure vital signs reviewed and stable Respiratory status: spontaneous breathing, respiratory function stable and patient connected to nasal cannula oxygen Cardiovascular status: blood pressure returned to baseline and stable Postop Assessment: no headache, no backache and no apparent nausea or vomiting Anesthetic complications: no    Last Vitals:  Vitals:   08/13/17 1030 08/13/17 1208  BP: 123/65 (!) 112/50  Pulse: 74 71  Resp: 15   Temp:  37.4 C  SpO2: 98% 94%    Last Pain:  Vitals:   08/13/17 1209  TempSrc:   PainSc: 3    Pain Goal:                 EchoStarMERRITT,Lashondra Vaquerano

## 2017-08-13 NOTE — Anesthesia Procedure Notes (Signed)
Spinal  Patient location during procedure: OR Staffing Anesthesiologist: Vashon Riordan, MD Performed: anesthesiologist  Preanesthetic Checklist Completed: patient identified, site marked, surgical consent, pre-op evaluation, timeout performed, IV checked, risks and benefits discussed and monitors and equipment checked Spinal Block Patient position: sitting Prep: DuraPrep Patient monitoring: heart rate, continuous pulse ox and blood pressure Approach: midline Location: L3-4 Injection technique: single-shot Needle Needle type: Sprotte  Needle gauge: 24 G Needle length: 9 cm Additional Notes Expiration date of kit checked and confirmed. Patient tolerated procedure well, without complications.       

## 2017-08-13 NOTE — Addendum Note (Signed)
Addendum  created 08/13/17 1446 by Orlie PollenMerritt, Enrica Corliss R, CRNA   Sign clinical note

## 2017-08-13 NOTE — Anesthesia Postprocedure Evaluation (Signed)
Anesthesia Post Note  Patient: Jocelyn Griffin  Procedure(s) Performed: Repeat CESAREAN SECTION (N/A Abdomen)     Patient location during evaluation: PACU Anesthesia Type: Spinal Level of consciousness: awake and alert Pain management: pain level controlled Vital Signs Assessment: post-procedure vital signs reviewed and stable Respiratory status: spontaneous breathing and respiratory function stable Cardiovascular status: blood pressure returned to baseline and stable Postop Assessment: no headache, no backache, spinal receding and no apparent nausea or vomiting Anesthetic complications: no    Last Vitals:  Vitals:   08/13/17 1015 08/13/17 1030  BP: (!) 118/59 123/65  Pulse: 65 74  Resp: 13 15  Temp:    SpO2: 99% 98%    Last Pain:  Vitals:   08/13/17 0930  TempSrc: Oral   Pain Goal:                 Phillips Groutarignan, Kyros Salzwedel

## 2017-08-13 NOTE — Anesthesia Preprocedure Evaluation (Addendum)
Anesthesia Evaluation  Patient identified by MRN, date of birth, ID band Patient awake    Reviewed: Allergy & Precautions, NPO status , Patient's Chart, lab work & pertinent test results  Airway Mallampati: II  TM Distance: >3 FB Neck ROM: Full    Dental no notable dental hx.    Pulmonary asthma ,    Pulmonary exam normal breath sounds clear to auscultation       Cardiovascular negative cardio ROS Normal cardiovascular exam Rhythm:Regular Rate:Normal     Neuro/Psych negative neurological ROS  negative psych ROS   GI/Hepatic negative GI ROS, Neg liver ROS,   Endo/Other  negative endocrine ROS  Renal/GU negative Renal ROS  negative genitourinary   Musculoskeletal negative musculoskeletal ROS (+)   Abdominal   Peds negative pediatric ROS (+)  Hematology negative hematology ROS (+)   Anesthesia Other Findings   Reproductive/Obstetrics (+) Pregnancy                             Anesthesia Physical Anesthesia Plan  ASA: II  Anesthesia Plan: General/Spinal and Spinal   Post-op Pain Management:    Induction:   PONV Risk Score and Plan: 2 and Ondansetron and Scopolamine patch - Pre-op  Airway Management Planned: Simple Face Mask  Additional Equipment:   Intra-op Plan:   Post-operative Plan:   Informed Consent: I have reviewed the patients History and Physical, chart, labs and discussed the procedure including the risks, benefits and alternatives for the proposed anesthesia with the patient or authorized representative who has indicated his/her understanding and acceptance.   Dental advisory given  Plan Discussed with:   Anesthesia Plan Comments: (Spinal!)       Anesthesia Quick Evaluation

## 2017-08-13 NOTE — Lactation Note (Signed)
This note was copied from a baby's chart. Lactation Consultation Note  Patient Name: Boy Florestine AversDenise Ytuarte WUJWJ'XToday's Date: 08/13/2017 Reason for consult: Initial assessment;Term Breastfeeding consultation services and support information given and reviewed.  Baby is 6 hours old and has been to breast 4 times.  Mom breastfed her first baby for 13 months.  Instructed to feed with feeding cues and call for assist/concerns prn.  Maternal Data Does the patient have breastfeeding experience prior to this delivery?: Yes  Feeding    LATCH Score                   Interventions    Lactation Tools Discussed/Used     Consult Status Consult Status: Follow-up Date: 08/14/17 Follow-up type: In-patient    Huston FoleyMOULDEN, Amayrani Bennick S 08/13/2017, 2:20 PM

## 2017-08-13 NOTE — Brief Op Note (Signed)
08/13/2017  8:39 AM  PATIENT:  Jocelyn Griffin  26 y.o. female  PRE-OPERATIVE DIAGNOSIS:  Previous Cesarean Section, keloid scar  POST-OPERATIVE DIAGNOSIS:  Repeat LTCS with 2 layer closure and Pfanensteil scar revision  PROCEDURE:  Procedure(s) with comments: Repeat CESAREAN SECTION (N/A) - EDD: 08/17/17 Time ok per Izora Ribasoley.  2 layer closure, LTCS Scar revision  SURGEON:  Surgeon(s) and Role:    * Noland FordyceFogleman, Takeysha Bonk, MD - Primary  PHYSICIAN ASSISTANT:   ASSISTANTS: Fredric MareBailey, CNM   ANESTHESIA:   spinal  EBL:  765 mL   BLOOD ADMINISTERED:none  DRAINS: Urinary Catheter (Foley)   LOCAL MEDICATIONS USED:  MARCAINE    and OTHER 40 unit Kenalog mixed with 30cc marcaine  SPECIMEN:  Source of Specimen:  placenta  DISPOSITION OF SPECIMEN:  L&D  COUNTS:  YES  TOURNIQUET:  * No tourniquets in log *  DICTATION: .Note written in EPIC  PLAN OF CARE: Admit to inpatient   PATIENT DISPOSITION:  PACU - hemodynamically stable.   Delay start of Pharmacological VTE agent (>24hrs) due to surgical blood loss or risk of bleeding: yes

## 2017-08-13 NOTE — Plan of Care (Signed)
Patient is having slight pain in her incisional area, but the pain is tolerable and she is able to relax.  She has been drinking water and juice without nausea.  Paperwork for admission to unit has been reviewed and signed.

## 2017-08-13 NOTE — Transfer of Care (Signed)
Immediate Anesthesia Transfer of Care Note  Patient: Jocelyn Griffin  Procedure(s) Performed: Repeat CESAREAN SECTION (N/A Abdomen)  Patient Location: PACU  Anesthesia Type:Spinal  Level of Consciousness: awake, alert , oriented and patient cooperative  Airway & Oxygen Therapy: Patient Spontanous Breathing  Post-op Assessment: Report given to RN and Post -op Vital signs reviewed and stable  Post vital signs: Reviewed and stable  Last Vitals:  Vitals:   08/13/17 0634 08/13/17 0635  BP:  120/77  Pulse:  (!) 109  Temp: 36.8 C     Last Pain:  Vitals:   08/13/17 0634  TempSrc: Oral         Complications: No apparent anesthesia complications

## 2017-08-14 ENCOUNTER — Encounter (HOSPITAL_COMMUNITY): Payer: Self-pay | Admitting: Obstetrics

## 2017-08-14 LAB — CBC
HCT: 31.2 % — ABNORMAL LOW (ref 36.0–46.0)
HEMOGLOBIN: 10.4 g/dL — AB (ref 12.0–15.0)
MCH: 28.3 pg (ref 26.0–34.0)
MCHC: 33.3 g/dL (ref 30.0–36.0)
MCV: 85 fL (ref 78.0–100.0)
Platelets: 295 10*3/uL (ref 150–400)
RBC: 3.67 MIL/uL — AB (ref 3.87–5.11)
RDW: 14.7 % (ref 11.5–15.5)
WBC: 8.4 10*3/uL (ref 4.0–10.5)

## 2017-08-14 NOTE — Progress Notes (Signed)
POSTOPERATIVE DAY # 1 S/P R-CS   S:         Reports feeling ok             Tolerating po intake / no nausea / no vomiting / no flatus / no BM             Bleeding is light             Pain controlled with long-acting narcotic             Up ad lib / ambulatory/ voiding QS  Newborn breast feeding  / Circumcision planned  O:  VS: BP 117/71   Pulse 67   Temp 98.1 F (36.7 C) (Oral)   Resp 18   Ht 5\' 4"  (1.626 m)   Wt 92.5 kg (204 lb)   SpO2 97%   Breastfeeding? Unknown   BMI 35.02 kg/m    LABS:               Recent Labs    08/12/17 1422 08/14/17 0511  WBC 6.7 8.4  HGB 12.2 10.4*  PLT 355 295               Bloodtype: --/--/B POS (11/25 1422)  Rubella: Immune (05/04 0000)                  tdap and flu up to date 2018                                      I&O: Intake/Output      11/26 0701 - 11/27 0700 11/27 0701 - 11/28 0700   I.V. (mL/kg) 2220 (24)    Total Intake(mL/kg) 2220 (24)    Urine (mL/kg/hr) 2950 (1.3)    Blood 765    Total Output 3715    Net -1495                    Physical Exam:             Alert and Oriented X3  Lungs: Clear and unlabored  Heart: regular rate and rhythm / no mumurs  Abdomen: soft, non-tender, non-distended, active BS             Fundus: firm, non-tender, Ueven             Dressing intact honeycomb              Incision:  approximated with suture / no erythema / no ecchymosis / no drainage  Perineum: intact  Lochia: light  Extremities: no edema, no calf pain or tenderness  A:        POD # 1 S/P R-CS              P:        Routine postoperative care              Advance post-op activity - anticipate DC tomorrow    Marlinda MikeBAILEY, Rocklyn Mayberry CNM, MSN, [facility] 08/14/2017, 7:53 AM

## 2017-08-14 NOTE — Progress Notes (Signed)
CSW received consult due to score of 11 on Edinburgh Depression Screen.   CSW met with MOB, FOB and MGM in MOB's room/104 to offer support and and education.  MOB was pleasant and welcoming.  She gave consent to speak openly with her family present.   MOB states she is feeling well at this time.  She was breast feeding infant and states it is going well so far.  She states the first two months of breast feeding her firstborn were difficult, but ended up breast feeding for over a year.  She states she is feeling better going in to this postpartum period because she knows what to expect.  However, she states she has noticed an increase in crying spells recently and feels she is "crying for no reason."  She told CSW that they have gotten some news at the end of this pregnancy that has been "stressful."  CSW suggests that maybe she has had reason to cry and asked how she copes with stress and emotion.  She reports that she has a great support system and that they have made sure she does not isolate herself and "wallow" in her sadness.  She states getting out of the house and doing things has been very helpful in feeling better emotionally and she plans to continue to make this a priority even though it will be different with a newborn at home.  CSW commends her for this.  MOB states she is feeling better now that baby has been born and they have been getting good news.  She states she has prepared herself to cope with whatever news they are given and not jump to the worst case scenario, but also not ignore bad news and pretend that everything is ok if it isn't. CSW provided education regarding Baby Blues vs PMADs.  MOB was engaged and attentive.  FOB thanked CSW for including the fact that fathers can experience symptoms of PMADs as well.  CSW stressed the importance of good communication and ensuring that each other's basic needs of eating and sleeping are being met.  CSW encouraged MOB to evaluate her mental health  throughout the postpartum period with the use of the New Mom Checklist developed by Postpartum [employer], as well as the Edinburgh Postnatal Depression Scale and notify a medical professional if symptoms arise.  MOB agreed and thanked CSW for the visit.  She feels she did not experience PMADs after her 2.26 year old was born, but acknowledges that symptoms can occur after any birth and that she may be at a greater risk given baby's medical concerns. 

## 2017-08-15 NOTE — Lactation Note (Signed)
This note was copied from a baby's chart. Lactation Consultation Note  Patient Name: Jocelyn Griffin MWNUU'VToday's Date: 08/15/2017 Reason for consult: Follow-up assessment;Other (Comment);Infant weight loss(low milk supply.)  Baby 52 hours old. Mom reports that she pumped earlier and only collected a drop. Discussed progression of milk coming to volume and enc mom to pump again followed by hand expression. Assisted mom with hand expression with no colostrum present. Mom reports colostrum earlier, and hearing baby swallow at breast. Assisted mom to latch baby to left breast in cradle position--enc mom to support baby's head and keep baby tucked closer to the breast. Mom reports nipple tenders at beginning of latch--discussed stretching of nipple. Baby nursed off-and-on, becoming sleepy at breast, but suckling with stimulation--no swallows noted by this LC at this feeding. Enc mom to post-pump after BF followed by hand expression. Discussed assessment and interventions with Meriam SpragueBeverly, Charity fundraiserN. Will follow-up with mom to determine EBM volume after pumping.   Maternal Data    Feeding Feeding Type: Breast Fed Length of feed: 20 min(off-and-on)  LATCH Score Latch: Grasps breast easily, tongue down, lips flanged, rhythmical sucking.  Audible Swallowing: None  Type of Nipple: Everted at rest and after stimulation  Comfort (Breast/Nipple): Filling, red/small blisters or bruises, mild/mod discomfort(first 15-20 seconds of latch.)  Hold (Positioning): Assistance needed to correctly position infant at breast and maintain latch.  LATCH Score: 6  Interventions Interventions: Assisted with latch;Hand express;Breast compression;Adjust position;Support pillows  Lactation Tools Discussed/Used Tools: Pump Breast pump type: Double-Electric Breast Pump Pump Review: Setup, frequency, and cleaning Initiated by:: BDaly, RN Date initiated:: 08/15/17   Consult Status Consult Status: Follow-up Date:  08/15/17 Follow-up type: In-patient    Jocelyn Griffin 08/15/2017, 11:49 AM

## 2017-08-15 NOTE — Lactation Note (Deleted)
This note was copied from a baby's chart. Lactation Consultation Note  Patient Name: Jocelyn Griffin AversDenise Brisk [id number]'ZToday's Date: 08/15/2017 Reason for consult: Follow-up assessment   Maternal Data Does the patient have breastfeeding experience prior to this delivery?: Yes  Feeding Feeding Type: Breast Fed(Simultaneous filing. User may not have seen previous data.) Length of feed: 15 min  LATCH Score Latch: Grasps breast easily, tongue down, lips flanged, rhythmical sucking.  Audible Swallowing: A few with stimulation  Type of Nipple: Everted at rest and after stimulation  Comfort (Breast/Nipple): Soft / non-tender  Hold (Positioning): Assistance needed to correctly position infant at breast and maintain latch.  LATCH Score: 8  Interventions Interventions: Assisted with latch;Adjust position;Support pillows  Lactation Tools Discussed/Used Tools: 6F feeding tube / Syringe Breast pump type: Double-Electric Breast Pump   Consult Status Consult Status: Follow-up Date: 08/16/17 Follow-up type: In-patient    Dahlia ByesBerkelhammer, Ruth [facility] 08/15/2017, 6:40 PM

## 2017-08-15 NOTE — Lactation Note (Addendum)
This note was copied from a baby's chart.  Lactation Consultation Note  Patient Name: Jocelyn Griffin: 08/15/2017   P2, 9.2% weight loss.  Reviewed hand expression, drops expressed. Baby was recently supplemented with formula with the help of Bev RN IBCLC. Baby still cueing upon entering. Reviewed how to use 5 french feeding tube with teachback so mother could do it independently. Baby took an additional 12 ml.. Reviewed volume guidelines and cleaning.      Maternal Data    Feeding Feeding Type: Breast Fed Length of feed: 17 min  LATCH Score Latch: Grasps breast easily, tongue down, lips flanged, rhythmical sucking.                 Interventions    Lactation Tools Discussed/Used     Consult Status      Dahlia ByesBerkelhammer, Astryd Pearcy [facility] 08/15/2017, 4:54 PM

## 2017-08-15 NOTE — Lactation Note (Signed)
This note was copied from a baby's chart. Lactation Consultation Note  Patient Name: Jocelyn Griffin WUJWJ'XToday's Date: 08/15/2017   Baby 55 hours old. Mom reports that she fell asleep and did not pump again. Baby at breast, latched deeply and nursing well. However, no swallows noted, and no ebm when this LC assisted with hand expression. Mom agreeable to supplementing baby at breast with formula using 5 JamaicaFrench feeding system. Mom would like to eat her meal first, so oncoming LC will assist mom with supplementation and review risks of formula and benefits of EBM.   Enc mom to continue putting baby to breast with cues, then supplement with EBM/formula according to guidelines--which were given with review. Enc mom to continue to post-pump followed by hand expression. Discussed with mom that she can reduce and eliminate formula use as her breast milk volumes increase.   Maternal Data    Feeding Feeding Type: Breast Fed Length of feed: 17 min  LATCH Score Latch: Grasps breast easily, tongue down, lips flanged, rhythmical sucking.                 Interventions    Lactation Tools Discussed/Used     Consult Status      Jocelyn Griffin 08/15/2017, 3:34 PM

## 2017-08-15 NOTE — Progress Notes (Signed)
POSTOPERATIVE DAY # 2 S/P Repeat LTCS, baby boy "Enzo"   S:         Reports feeling better today, minimal incisional pain              Tolerating po intake / no nausea / no vomiting / + flatus / + BM  Denies dizziness, SOB, or CP             Bleeding is moderate, heavy             Pain controlled withMotrin and Percocet             Up ad lib / ambulatory/ voiding QS  Newborn breast feeding with some formula supplementing due to infant's weight loss and low colostrum / Circumcision - done yesterday    O:  VS: BP 120/66 (BP Location: Left Arm)   Pulse 79   Temp 98.6 F (37 C) (Oral)   Resp 18   Ht 5\' 4"  (1.626 m)   Wt 92.5 kg (204 lb)   SpO2 98%   Breastfeeding? Unknown   BMI 35.02 kg/m    LABS:               Recent Labs    08/12/17 1422 08/14/17 0511  WBC 6.7 8.4  HGB 12.2 10.4*  PLT 355 295               Bloodtype: --/--/B POS (11/25 1422)  Rubella: Immune (05/04 0000)                                             I&O: Intake/Output      11/27 0701 - 11/28 0700 11/28 0701 - 11/29 0700   I.V. (mL/kg)     Total Intake(mL/kg)     Urine (mL/kg/hr)     Blood     Total Output     Net                       Physical Exam:             Alert and Oriented X3  Lungs: Clear and unlabored  Heart: regular rate and rhythm / no murmurs  Abdomen: soft, non-tender, non-distended, active bowel sounds in all quadrants             Fundus: firm, non-tender, U-2             Dressing: honeycomb dressing with steri-strips c/d/i              Incision:  approximated with sutures / no erythema / no ecchymosis / no drainage  Perineum: intact  Lochia: appropriate, no clots  Extremities: trace pedal edema, no calf pain or tenderness  A:        POD # 2 S/P Repeat LTCS            Maternal IDA on admission - stable, asymptomatic   P:        Routine postoperative care              See lactation today  Discharge mom depending on Peds recommendations - pt. Wants to stay inpatient if her  baby needs to stay tonight - RN notified to call me if okay to be discharged   Discharge instructions reviewed   F/u with Dr. Ernestina PennaFogleman in 6 weeks  Lars Pinks, MSN, CNM [facility]

## 2017-08-16 MED ORDER — IBUPROFEN 600 MG PO TABS
600.0000 mg | ORAL_TABLET | Freq: Four times a day (QID) | ORAL | 0 refills | Status: DC
Start: 2017-08-16 — End: 2020-08-23

## 2017-08-16 MED ORDER — OXYCODONE-ACETAMINOPHEN 5-325 MG PO TABS: 1.0000 | ORAL_TABLET | ORAL | 0 refills | Status: DC | PRN

## 2017-08-16 NOTE — Discharge Summary (Signed)
Obstetric Discharge Summary   Patient Name: Jocelyn Griffin DOB: Sep 04, 1991 MRN: [id number]  Date of Admission: 08/13/2017 Date of Discharge: 08/16/2017 Date of Delivery: 08/13/17 Gestational Age at Delivery: 7267w3d  Primary OB: [facility] - Dr. Ernestina PennaFogleman   Antepartum complications:  PCS for breech, initially planning TOLAC but given ventriculomegaly pt opting for scheduled RCS - Ventriculomegaly, first noticed at 35 wks with growth u/s, no clear etiology, s/p MFM consult, pt did have viral GI illness at 13 wks pregnancy which was not further evaluated. B/l ventricles at 19mm at 35 wks.  - GBS pos Prenatal Labs:  ABO, Rh: --/--/B POS (11/25 1422) Antibody: NEG (11/25 1422) Rubella: Immune (05/04 0000) RPR: Nonreactive (05/04 0000)  HBsAg: Negative (05/04 0000)  HIV: Non-reactive (05/04 0000)  GBS: Positive (10/31 0000)  1 hr Glucola 105          Genetic screening declined Anatomy US normal  Admitting Diagnosis: Repeat LTCS at term   Secondary Diagnoses: Patient Active Problem List   Diagnosis Date Noted  . Postpartum care following cesarean delivery (11/26) 08/14/2017  . Previous cesarean section 08/13/2017  . Cesarean delivery  08/13/2017  . Status post repeat low transverse cesarean section 08/13/2017   Date of Delivery: 08/13/17 Delivered By: Dr. Nash DimmerFogleman, T. Bailey, CNM assist Delivery Type: repeat cesarean section, low transverse incision  Newborn Data: Live born female  Birth Weight: 8 lb 5.7 oz (3790 g) APGAR: 9, 9  Newborn Delivery   Birth date/time:  08/13/2017 07:48:00 Delivery type:  C-Section, Low Transverse C-section categorization:  Repeat     Postpartum Course  (Cesarean Section):  Patient had an uncomplicated postpartum course.  By time of discharge on POD#3, her pain was controlled on oral pain medications; she had appropriate lochia and was ambulating, voiding without difficulty, tolerating regular diet and passing flatus.   She was deemed  stable for discharge to home.     Labs: CBC Latest Ref Rng & Units 08/14/2017 08/12/2017 12/15/2014  WBC 4.0 - 10.5 K/uL 8.4 6.7 11.3(H)  Hemoglobin 12.0 - 15.0 g/dL 10.4(L) 12.2 11.4(L)  Hematocrit 36.0 - 46.0 % 31.2(L) 37.9 33.3(L)  Platelets 150 - 400 K/uL 295 355 311   B POS  Physical exam:  BP 115/73 (BP Location: Left Arm)   Pulse 80   Temp 98 F (36.7 C) (Oral)   Resp 16   Ht 5\' 4"  (1.626 m)   Wt 92.5 kg (204 lb)   SpO2 96%   Breastfeeding? Unknown   BMI 35.02 kg/m  General: alert and no distress Pulm: normal respiratory effort Lochia: appropriate Abdomen: soft, NT Uterine Fundus: firm, below umbilicus Perineum: healing well, no significant erythema, no significant edema Incision: c/d/i, healing well, no significant drainage, no dehiscence, no significant erythema Extremities: No evidence of DVT seen on physical exam. No lower extremity edema.  Disposition: stable, discharge to home Baby Feeding: breast milk and formula Baby Disposition: home with mom  Contraception: unsure  Rh Immune globulin given: N/A Rubella vaccine given: N/A Tdap vaccine given in AP or PP setting: UTD Flu vaccine given in AP or PP setting: UTD   Plan:  Jocelyn Murdersenise A Griffin was discharged to home in good condition. [facility]  Discharge Instructions: Per After Visit Summary. Activity: Advance as tolerated. Pelvic rest for 6 weeks.  Refer to After Visit Summary Diet: Regular, Heart Healthy Discharge Medications: Allergies as of 08/16/2017   No Known Allergies     Medication List  STOP taking these medications   acetaminophen 325 MG tablet Commonly known as:  TYLENOL   lanolin Oint     TAKE these medications   ibuprofen 600 MG tablet Commonly known as:  ADVIL,MOTRIN Take 1 tablet (600 mg total) by mouth every 6 (six) hours.   oxyCODONE-acetaminophen 5-325 MG tablet Commonly known as:  PERCOCET/ROXICET Take 1 tablet by mouth every  4 (four) hours as needed (pain scale 4-7). What changed:  reasons to take this   prenatal multivitamin Tabs tablet Take 1 tablet by mouth daily.   VENTOLIN HFA 108 (90 Base) MCG/ACT inhaler Generic drug:  albuterol USE 2 PUFFS EVERY 4-6 HOURS AS NEEDED What changed:  See the new instructions.      Outpatient follow up:  Follow-up Information    Noland FordyceFogleman, Kelly, MD. Schedule an appointment as soon as possible for a visit in 6 week(s).   Specialty:  Obstetrics and Gynecology Why:  Postpartum visit  Contact information: [address] [place] [id number] [phone]           Signed:  Carlean JewsMeredith Chanee Henrickson, MSN, CNM [facility]

## 2017-08-16 NOTE — Lactation Note (Addendum)
This note was copied from a baby's chart. Lactation Consultation Note: Infant is 10 % weight loss. Mother reports that infant is feeding well. She has slight positional strips on tips of her nipples. Mother was given comfort gels.Mother denies painful latch. Mother has been supplementing infant with ebm and formula.   Mother assist with pumping. Mother pumped 8 ml . Lots of teaching with mother. Mother advised to continue to breastfeed infant with all feeding cues and at least 8-12 times in 24 hours. [facility] with [facility] feeding. Mother has a Spectra pump at home. She was advised to continue to post pump after each feeding and until milk comes to volume. Mothers breast are filling.  Discussed treatment and prevention of engorgement. Reviewed S/S of Mastitis.  Patient Name: Jocelyn Griffin'VToday's Date: 08/16/2017 Reason for consult: Follow-up assessment   Maternal Data    Feeding Feeding Type: Breast Fed Length of feed: 10 min  LATCH Score Latch: Grasps breast easily, tongue down, lips flanged, rhythmical sucking.  Audible Swallowing: Spontaneous and intermittent  Type of Nipple: Everted at rest and after stimulation  Comfort (Breast/Nipple): Filling, red/small blisters or bruises, mild/mod discomfort  Hold (Positioning): No assistance needed to correctly position infant at breast.  LATCH Score: 9  Interventions Interventions: Breast compression;Adjust position;Support pillows  Lactation Tools Discussed/Used     Consult Status Consult Status: Follow-up Date: 08/16/17 Follow-up type: In-patient    Stevan BornKendrick, Mikle Sternberg [facility] 08/16/2017, 1:44 PM

## 2017-08-16 NOTE — Lactation Note (Signed)
This note was copied from a baby's chart. Lactation Consultation Note: Mother paged for latch check. Infant placed in football hold on the left breast. Observed good burst of rhythmic suckling and swallows. Infant was given 8 ml of ebm with a curved tip syringe at the breast.  Mother has been using a #5 fr feeding tube for supplementing. Advised mother to use method of choice.  Mother encouraged to continue to cue base feed and do frequent skin to skin. Mother is aware of available LC services.   Patient Name: Jocelyn Florestine AversDenise Griffin ZOXWR'UToday's Date: 08/16/2017 Reason for consult: Follow-up assessment   Maternal Data    Feeding Feeding Type: Breast Fed Length of feed: 10 min  LATCH Score Latch: Grasps breast easily, tongue down, lips flanged, rhythmical sucking.  Audible Swallowing: Spontaneous and intermittent  Type of Nipple: Everted at rest and after stimulation  Comfort (Breast/Nipple): Filling, red/small blisters or bruises, mild/mod discomfort  Hold (Positioning): No assistance needed to correctly position infant at breast.  LATCH Score: 9  Interventions Interventions: Breast compression;Adjust position;Support pillows  Lactation Tools Discussed/Used     Consult Status Consult Status: Follow-up Date: 08/16/17 Follow-up type: In-patient    Stevan BornKendrick, Aadhira Heffernan [facility] 08/16/2017, 1:49 PM

## 2017-08-16 NOTE — Progress Notes (Signed)
POSTOPERATIVE DAY # 3 S/P Repeat LTCS, baby boy "Enzo"  S:         Reports feeling much better today and ready to go home . Reports headache from sleep deprivation              Tolerating po intake / no nausea / no vomiting / + flatus / + BM  Denies dizziness, SOB, or CP             Bleeding is moderate             Pain controlled with Motrin and Percocet             Up ad lib / ambulatory/ voiding QS  Newborn breast feeding with formula supplemenation  / Circumcision - done   O:  VS: BP 115/73 (BP Location: Left Arm)   Pulse 80   Temp 98 F (36.7 C) (Oral)   Resp 16   Ht 5\' 4"  (1.626 m)   Wt 92.5 kg (204 lb)   SpO2 96%   Breastfeeding? Unknown   BMI 35.02 kg/m    LABS:               Recent Labs    08/14/17 0511  WBC 8.4  HGB 10.4*  PLT 295               Bloodtype: --/--/B POS (11/25 1422)  Rubella: Immune (05/04 0000)                                             I&O: Intake/Output    None                Physical Exam:             Alert and Oriented X3  Lungs: Clear and unlabored  Heart: regular rate and rhythm / no murmurs  Abdomen: soft, non-tender, non-distended , active bowel sounds             Fundus: firm, non-tender, U-3             Dressing: honeycomb dressing with steri-strips c/d/i               Incision:  approximated with sutures / no erythema / no ecchymosis / no drainage  Perineum: intact  Lochia: appropriate, no clots   Extremities: no edema, no calf pain or tenderness  A:        POD # 3 S/P Repeat LTCS            Maternal IDA on admission - stable, asymptomatic   P:        Routine postoperative care              Discharge home   WOB discharge book and instructions reviewed   F/u in 6 weeks with Dr. Roosvelt MaserFogleman   Meredith Sigmon, MSN, CNM [facility]

## 2017-08-20 DIAGNOSIS — Q040 Congenital malformations of corpus callosum: Secondary | ICD-10-CM | POA: Diagnosis not present

## 2017-08-20 DIAGNOSIS — Q049 Congenital malformation of brain, unspecified: Secondary | ICD-10-CM | POA: Diagnosis not present

## 2017-08-22 DIAGNOSIS — N1330 Unspecified hydronephrosis: Secondary | ICD-10-CM | POA: Diagnosis not present

## 2017-08-22 DIAGNOSIS — Q620 Congenital hydronephrosis: Secondary | ICD-10-CM | POA: Diagnosis not present

## 2017-09-05 ENCOUNTER — Encounter (HOSPITAL_COMMUNITY): Payer: Self-pay | Admitting: Obstetrics

## 2017-09-21 DIAGNOSIS — Z6831 Body mass index (BMI) 31.0-31.9, adult: Secondary | ICD-10-CM | POA: Diagnosis not present

## 2018-06-13 DIAGNOSIS — Z01419 Encounter for gynecological examination (general) (routine) without abnormal findings: Secondary | ICD-10-CM | POA: Diagnosis not present

## 2018-06-13 DIAGNOSIS — Z131 Encounter for screening for diabetes mellitus: Secondary | ICD-10-CM | POA: Diagnosis not present

## 2018-06-13 DIAGNOSIS — Z1329 Encounter for screening for other suspected endocrine disorder: Secondary | ICD-10-CM | POA: Diagnosis not present

## 2018-06-13 DIAGNOSIS — Z6829 Body mass index (BMI) 29.0-29.9, adult: Secondary | ICD-10-CM | POA: Diagnosis not present

## 2018-06-13 DIAGNOSIS — Z118 Encounter for screening for other infectious and parasitic diseases: Secondary | ICD-10-CM | POA: Diagnosis not present

## 2018-06-13 DIAGNOSIS — Z23 Encounter for immunization: Secondary | ICD-10-CM | POA: Diagnosis not present

## 2018-06-13 DIAGNOSIS — Z0000 Encounter for general adult medical examination without abnormal findings: Secondary | ICD-10-CM | POA: Diagnosis not present

## 2018-06-13 DIAGNOSIS — Z13220 Encounter for screening for lipoid disorders: Secondary | ICD-10-CM | POA: Diagnosis not present

## 2018-10-09 ENCOUNTER — Encounter: Payer: Self-pay | Admitting: Medical

## 2018-10-09 ENCOUNTER — Ambulatory Visit: Payer: Self-pay | Admitting: Medical

## 2018-10-09 VITALS — BP 132/81 | HR 73 | Temp 98.3°F | Resp 16 | Ht 64.0 in | Wt 186.0 lb

## 2018-10-09 DIAGNOSIS — L089 Local infection of the skin and subcutaneous tissue, unspecified: Secondary | ICD-10-CM

## 2018-10-09 MED ORDER — AMOXICILLIN 875 MG PO TABS: 875.0000 mg | ORAL_TABLET | Freq: Two times a day (BID) | ORAL | 0 refills | Status: DC

## 2018-10-09 NOTE — Patient Instructions (Signed)

## 2018-10-09 NOTE — Progress Notes (Signed)
   Subjective:    Patient ID: Jocelyn Griffin, female    DOB: July 04, 1991, 28 y.o.   MRN: [id number]  HPI 28 yo female in non acute distress. Started Sunday with pain in the left 2nd toe and with redness and swelling, rates pain 8/10. Has taken nothing for pain. Trims her own nails.  Currently breast feeding.  Last Pedicure in August. Using OB/GYN as her PCP.  Blood pressure 132/81, pulse 73, temperature 98.3 F (36.8 C), resp. rate 16, height 5\' 4"  (1.626 m), weight 186 lb (84.4 kg), SpO2 97 %, unknown if currently breastfeeding. No Known Allergies  Review of Systems  Constitutional: Negative for chills and fever.  HENT: Negative for congestion, ear pain and sore throat.   Eyes: Negative for discharge and itching.  Respiratory: Negative for cough and shortness of breath.   Cardiovascular: Negative for chest pain.  Gastrointestinal: Negative for abdominal pain, diarrhea, rectal pain and vomiting.  Endocrine: Negative for polydipsia, polyphagia and polyuria.  Genitourinary: Negative for dysuria.  Musculoskeletal: Negative for myalgias.  Skin: Negative for rash.  Allergic/Immunologic: Negative for environmental allergies and food allergies.  Neurological: Negative for dizziness, syncope and light-headedness.  Hematological: Negative for adenopathy.  Psychiatric/Behavioral: Negative for behavioral problems, confusion, self-injury and suicidal ideas.        taking Camilla BCP Objective:   Physical Exam Constitutional:      Appearance: Normal appearance.  HENT:     Head: Normocephalic and atraumatic.     Mouth/Throat:     Mouth: Mucous membranes are moist.  Eyes:     Extraocular Movements: Extraocular movements intact.     Conjunctiva/sclera: Conjunctivae normal.     Pupils: Pupils are equal, round, and reactive to light.  Skin:    General: Skin is warm and dry.     Findings: Erythema (left 2nd toe distally around nail, no diischarge , nothing to  I&D,  erythema around  nail and distal area of toe.) present.  Neurological:     General: No focal deficit present.     Mental Status: She is alert and oriented to person, place, and time.  Psychiatric:        Mood and Affect: Mood normal.        Behavior: Behavior normal.        Thought Content: Thought content normal.        Judgment: Judgment normal.           Assessment & Plan:  Toe infection 2nd left, distal. Wear comfortable shoes to accommodate swelling. Advil up to 600 mg every 6 hours. Take with food. Ice , elveate., neosporin to the skin and nail area. tiwice daily and a bandage, applied in clinic..  Follow up in 3-5 days if not improving.with your doctor or return to the clinic . Information given on cellulitis. Patient verbalizes understanding and has  no questions at discharge.

## 2018-10-28 ENCOUNTER — Encounter: Payer: Self-pay | Admitting: Medical

## 2018-10-28 ENCOUNTER — Ambulatory Visit: Payer: Self-pay | Admitting: Medical

## 2018-10-28 VITALS — BP 151/86 | HR 98 | Temp 99.0°F | Resp 18 | Wt 186.0 lb

## 2018-10-28 DIAGNOSIS — H6501 Acute serous otitis media, right ear: Secondary | ICD-10-CM

## 2018-10-28 DIAGNOSIS — B9789 Other viral agents as the cause of diseases classified elsewhere: Secondary | ICD-10-CM

## 2018-10-28 DIAGNOSIS — J329 Chronic sinusitis, unspecified: Secondary | ICD-10-CM

## 2018-10-28 DIAGNOSIS — R52 Pain, unspecified: Secondary | ICD-10-CM

## 2018-10-28 LAB — POCT INFLUENZA A/B
Influenza A, POC: NEGATIVE
Influenza B, POC: NEGATIVE

## 2018-10-28 MED ORDER — AMOXICILLIN 875 MG PO TABS: 875.0000 mg | ORAL_TABLET | Freq: Two times a day (BID) | ORAL | 0 refills | Status: DC

## 2018-10-28 NOTE — Progress Notes (Signed)
Subjective:    Patient ID: Jocelyn Griffin, female    DOB: 12/11/1990, 28 y.o.   MRN: [id number]  HPI 28 yo female non acute distress.  Started Saturday afternoon with naal congestion, yesterday with  fatigue bodyaches cough non productve , runny nose clear. Denies fever or chills. Possibly exposed to the flu , coworker with the flu.  No antipyrectics. Currently breast feeding No PCP just has OB/GYN  Blood pressure (!) 151/86, pulse 98, temperature 99 F (37.2 C), temperature source Tympanic, resp. rate 18, weight 186 lb (84.4 kg), SpO2 100 %, currently breastfeeding. No Known Allergies    Review of Systems  Constitutional: Positive for fatigue. Negative for chills and fever.  HENT: Positive for congestion, ear pain (right), postnasal drip, rhinorrhea, sinus pressure, sinus pain (forehead), sneezing and sore throat. Negative for ear discharge.   Eyes: Negative for discharge and itching.  Respiratory: Positive for cough. Negative for chest tightness, shortness of breath and wheezing.   Cardiovascular: Negative for chest pain.  Gastrointestinal: Negative for abdominal pain, diarrhea, nausea and vomiting.  Endocrine: Negative for polyphagia and polyuria.  Genitourinary: Negative for dysuria.  Musculoskeletal: Positive for myalgias.  Skin: Negative for rash.  Allergic/Immunologic: Positive for environmental allergies. Negative for food allergies.  Neurological: Positive for headaches. Negative for dizziness, syncope and light-headedness.  Hematological: Positive for adenopathy.  Psychiatric/Behavioral: Negative for behavioral problems, confusion, self-injury and suicidal ideas.      did get flu vaccine in Sept 19 Objective:   Physical Exam Vitals signs and nursing note reviewed.  Constitutional:      Appearance: Normal appearance.  HENT:     Head: Normocephalic and atraumatic.     Jaw: There is normal jaw occlusion.     Right Ear: Hearing, ear canal and external ear  normal. Tympanic membrane is erythematous.     Left Ear: Hearing, ear canal and external ear normal. A middle ear effusion is present.     Nose: Congestion present.     Mouth/Throat:     Mouth: Mucous membranes are moist.     Pharynx: Oropharynx is clear.  Eyes:     Extraocular Movements: Extraocular movements intact.     Conjunctiva/sclera: Conjunctivae normal.     Pupils: Pupils are equal, round, and reactive to light.  Neck:     Musculoskeletal: Normal range of motion and neck supple.  Cardiovascular:     Rate and Rhythm: Normal rate and regular rhythm.     Heart sounds: Normal heart sounds.  Pulmonary:     Effort: Pulmonary effort is normal.     Breath sounds: Normal breath sounds.  Musculoskeletal: Normal range of motion.  Lymphadenopathy:     Cervical: No cervical adenopathy.  Skin:    General: Skin is warm and dry.     Capillary Refill: Capillary refill takes less than 2 seconds.  Neurological:     General: No focal deficit present.     Mental Status: She is alert and oriented to person, place, and time.  Psychiatric:        Mood and Affect: Mood normal.        Behavior: Behavior normal.        Thought Content: Thought content normal.        Judgment: Judgment normal.      Flu  negative     Assessment & Plan:  Otitis media right  Viral sinusitis Meds ordered this encounter  Medications  . amoxicillin (AMOXIL) 875 MG tablet  Sig: Take 1 tablet (875 mg total) by mouth 2 (two) times daily.    Dispense:  20 tablet    Refill:  0  rest fluids, OTC Motrin or Tyleynol as directed. , Gatorade or Powerade if not eating well. If worsening in 3-5 days to return to clinic or her PCP for reevaluation. Patient verbalizes understanding and has no questions at discharge.

## 2018-11-04 IMAGING — US US MFM OB DETAIL+14 WK
1 series · 13 of 28 positions shown · non-contrast
Comparison: none

[Series 1: us mfm ob detail+14 wk · 88 acquisitions, 13 frames shown]
[im 4/88]
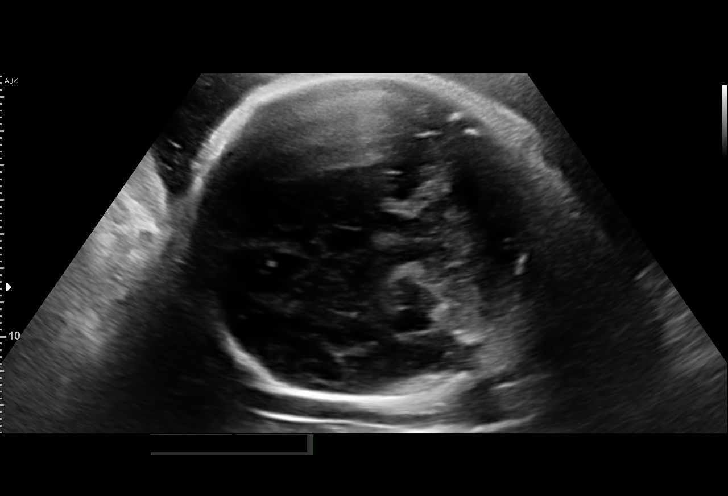
[im 10/88]
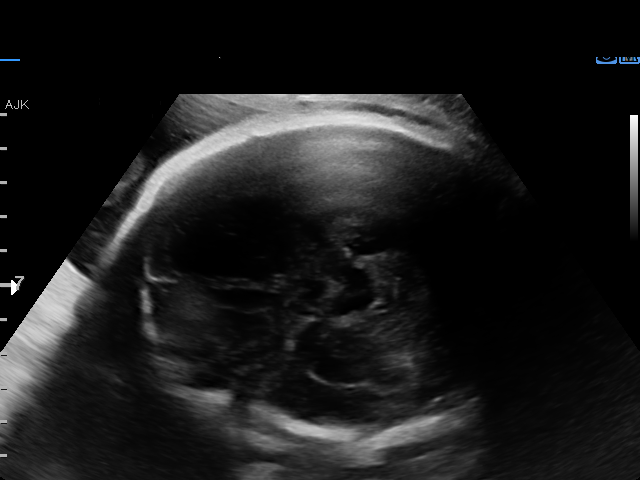
[im 17/88]
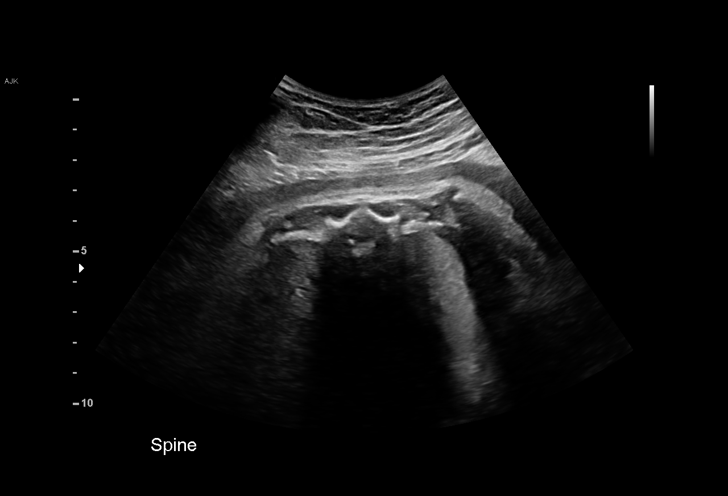
[im 23/88]
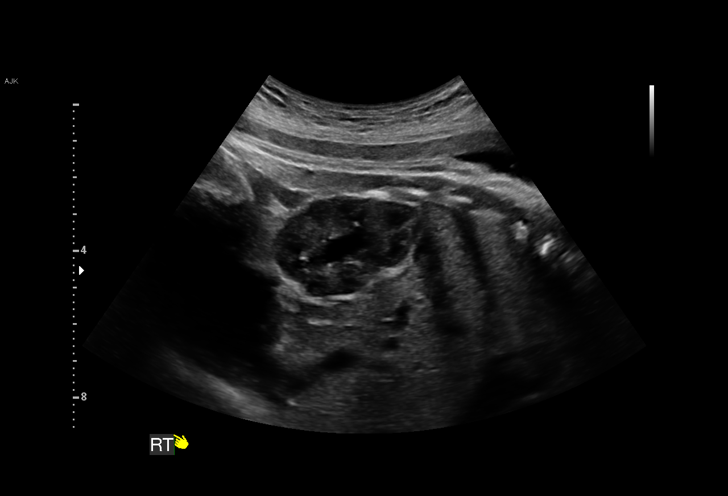
[im 30/88]
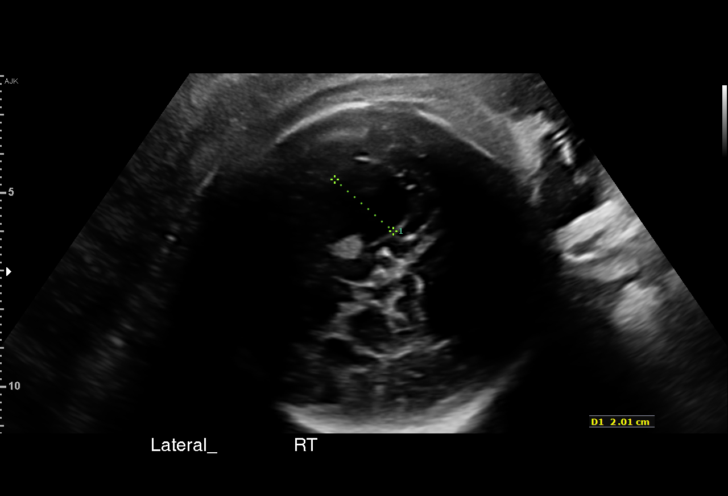
[im 36/88]
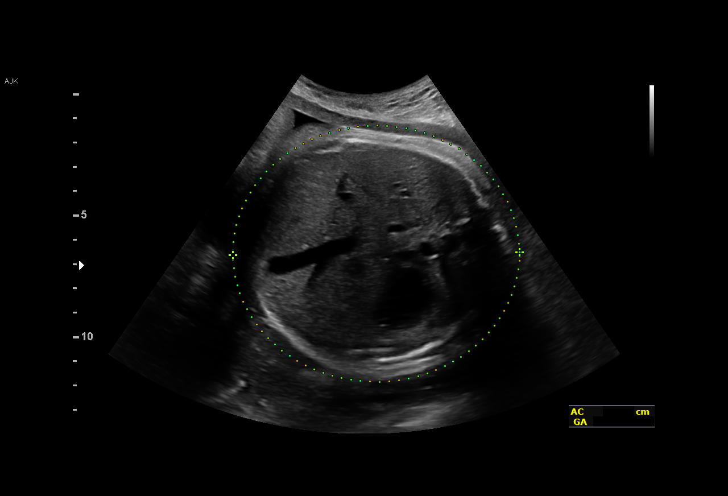
[im 46/88]
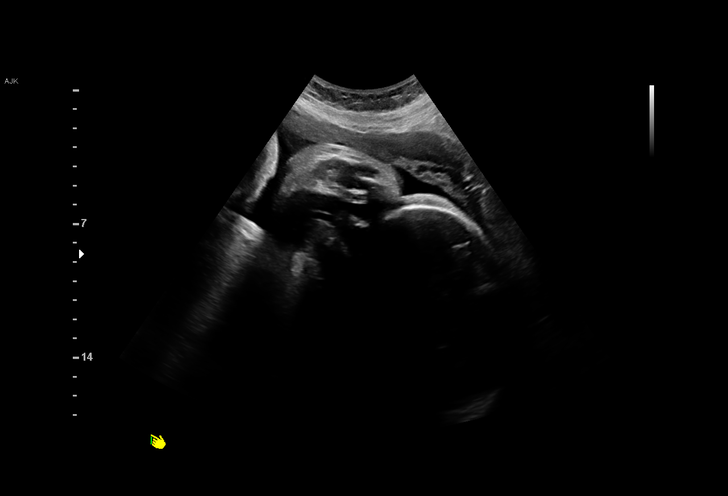
[im 52/88]
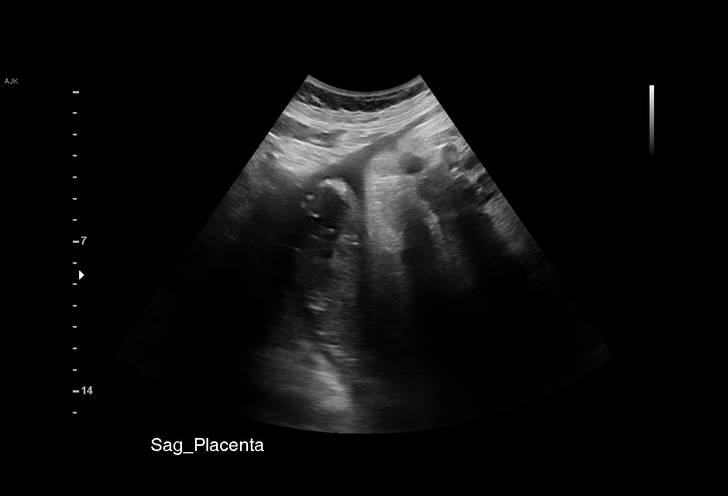
[im 59/88]
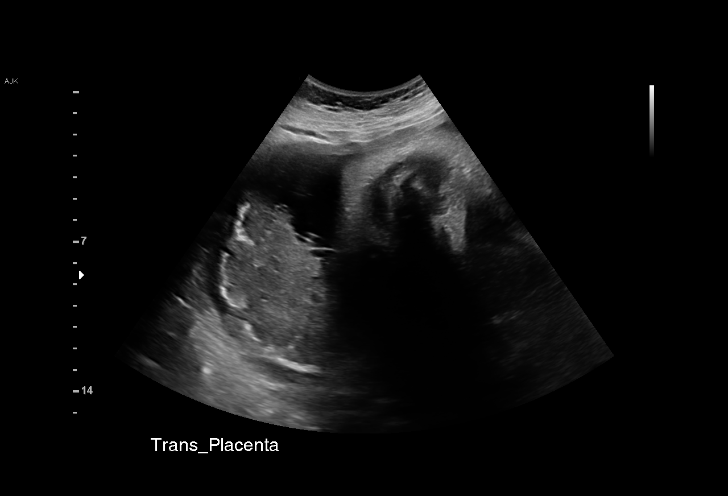
[im 65/88]
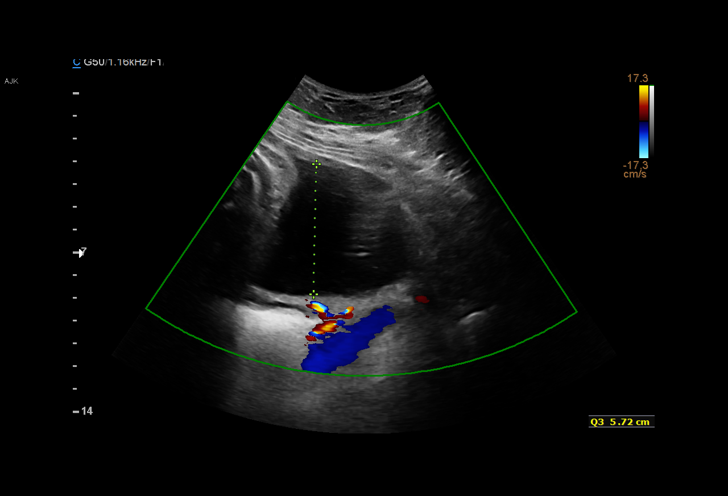
[im 71/88]
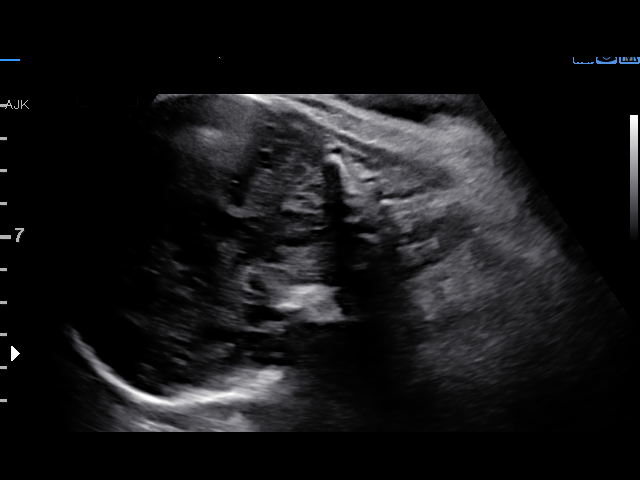
[im 78/88]
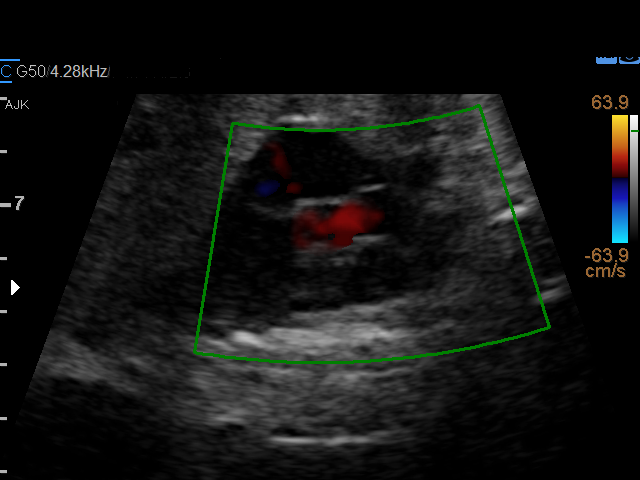
[im 84/88]
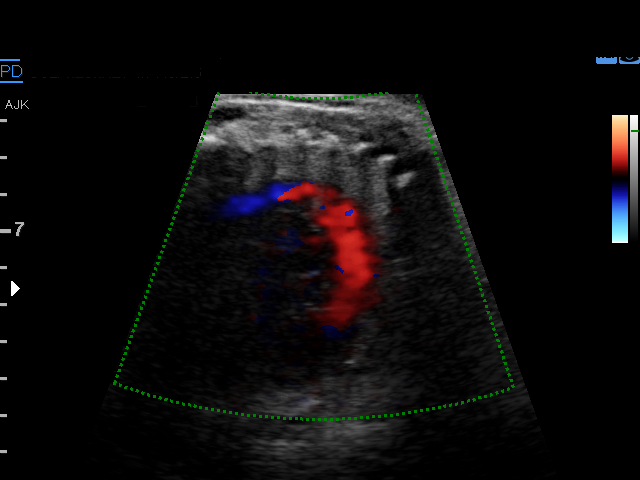

[13 of 28 positions shown; findings below may reference images not displayed]

OB/GYN &
Infertility
[id number] [HOSPITAL]

1  JOHN-OLE NAESHEIM           [id number]      [id number]     [id number]
Indications

36 weeks gestation of pregnancy
Cerebral ventriculomegaly
Encounter for fetal anatomic survey
OB History

Blood Type:            Height:  5'4"   Weight (lb):  203       BMI:
Gravidity:    2         Term:   1        Prem:   0        SAB:   0
TOP:          0       Ectopic:  0        Living: 1
Fetal Evaluation

Num Of Fetuses:     1
Fetal Heart         135
Rate(bpm):
Cardiac Activity:   Observed
Presentation:       Cephalic
Placenta:           Posterior, above cervical os
P. Cord Insertion:  Visualized, central

Amniotic Fluid
AFI FV:      Subjectively within normal limits

AFI Sum(cm)     %Tile       Largest Pocket(cm)
19.84           75

RUQ(cm)       RLQ(cm)       LUQ(cm)        LLQ(cm)
5.72
Biometry

BPD:      95.5  mm     G. Age:  39w 0d         98  %    CI:        73.22   %    70 - 86
FL/HC:      18.2   %    20.8 -
HC:      354.7  mm     G. Age:  41w 4d       > 97  %    HC/AC:      1.05        0.92 -
AC:      339.4  mm     G. Age:  37w 6d         90  %    FL/BPD:     67.6   %    71 - 87
FL:       64.6  mm     G. Age:  33w 2d        < 3  %    FL/AC:      19.0   %    20 - 24

Est. FW:    5469  gm           7 lb     80  %
Gestational Age

Clinical EDD:  36w 4d                                        EDD:   08/17/17
U/S Today:     38w 0d                                        EDD:   08/07/17
Best:          36w 4d     Det. By:  Clinical EDD             EDD:   08/17/17
Anatomy

Cranium:               Appears normal         Aortic Arch:            Appears normal
Cavum:                 Appears normal         Ductal Arch:            Appears normal
Ventricles:            Bilat.ventriculomeg    Diaphragm:              Appears normal
Ulibarri, Chanika=1.6
Choroid Plexus:        Appears normal         Stomach:                Appears normal, left
sided
Cerebellum:            Appears normal         Abdomen:                Appears normal
Posterior Fossa:       Appears normal         Abdominal Wall:         Not well visualized
Nuchal Fold:           Not applicable (>20    Cord Vessels:           Appears normal (3
wks GA)                                        vessel cord)
Face:                  Not well visualized    Kidneys:                Left sided
pyelectasis, 8 mm
Lips:                  Appears normal         Bladder:                Appears normal
Thoracic:              Appears normal         Spine:                  Appears normal
Heart:                 Appears normal         Upper Extremities:      Not well visualized
(4CH, axis, and
situs)
RVOT:                  Appears normal         Lower Extremities:      Visualized
LVOT:                  Appears normal

Other:  Parents do not wish to know sex of fetus. Fetus appears to be a male.
Cervix Uterus Adnexa

Cervix
Not visualized (advanced GA >38wks)

Uterus
No abnormality visualized.

Left Ovary
Not visualized.

Right Ovary
Not visualized.

Adnexa:       No abnormality visualized.
Impression

SIUP at 36+4 weeks
Cephalic presentation
Bilateral, moderate ventriculomegaly (1.6 cms on left); mildly
dilated 3rd ventricle; UTD A1 on left (mild pyelectasis, renal
pelvis measured 8 mms)
All other detailed fetal anatomy was seen and appeared
normal; limited views of orbits, profile, CI and upper
extremities
Normal amniotic fluid volume
Measurements consistent with stated EDC; EFW at the 80th
%tile; BPD and HC > 97th %tiles - most likely familial
characteristic rather than macrocephaly from the enlarged
ventricles

The US findings were shared with Ms. Seffredie Goc-Ong and her
husband. The implications of ventriculomegaly were
discussed in detail. She was offered screening/testing for
aneuploidy and CMV/toxo and a prenatal consultation with a
pediatric neurosurgeon. Due to her advanced gestational
age, Ms. Seffredie Goc-Ong decided to postpone testing and meeting
with a neurosurgeon until after delivery.
Recommendations

Routine prenatal care and delivery
Peds staff to attend delivery
Name on [facility]AC list
Postnatal evaluation and work-up of bilateral
ventriculomegaly and unilateral UTD A1

## 2019-07-01 ENCOUNTER — Ambulatory Visit: Payer: Self-pay

## 2019-07-03 ENCOUNTER — Other Ambulatory Visit: Payer: Self-pay

## 2019-07-03 ENCOUNTER — Ambulatory Visit: Payer: Self-pay | Admitting: Nurse Practitioner

## 2019-07-03 VITALS — BP 132/79 | HR 64 | Temp 98.4°F | Resp 16 | Ht 64.0 in | Wt 188.0 lb

## 2019-07-03 DIAGNOSIS — B9789 Other viral agents as the cause of diseases classified elsewhere: Secondary | ICD-10-CM

## 2019-07-03 NOTE — Progress Notes (Signed)
   Subjective:    Patient ID: Jocelyn Griffin, female    DOB: September 10, 1991, 28 y.o.   MRN: [id number]  HPI Maniya is here today with c/o throat pain. She admits recent random COVID testing (06/30/2019) through job which was negative on today but her symptoms of sore throat have remained x 2 days. She hasn't been to work due to symptom of sore throat. She denies any other symptoms. She denies fever, nasal congestion, ear pain, SOB, chest pain or wheezing. She has taken Advil for the throat discomfort with relief of the sore throat. She reports her sore throat hasn't worsened or improved.      Review of Systems  Constitutional: Negative for fatigue and fever.  HENT: Negative for ear pain and sore throat.   Respiratory: Negative for shortness of breath and wheezing.   Cardiovascular: Negative for chest pain.       Objective:   Physical Exam Constitutional:      Appearance: She is well-developed.  HENT:     Head: Normocephalic and atraumatic.     Ears:     Comments: Unable to see left EAC, right eac partially blocked with cerumen and fluid behind intact nonerythematous TM    Nose: No congestion.     Mouth/Throat:     Mouth: Mucous membranes are moist.     Pharynx: Oropharynx is clear. Uvula midline. No posterior oropharyngeal erythema.     Tonsils: No tonsillar exudate or tonsillar abscesses. 2+ on the right. 2+ on the left.     Comments: Bilateral tonsillar 2+ with no erythema and cobblestoning.  Neck:     Musculoskeletal: Normal range of motion and neck supple.     Comments: Bilateral upper anterior cervical lymphadenopathy Cardiovascular:     Rate and Rhythm: Normal rate and regular rhythm.     Heart sounds: Normal heart sounds.  Pulmonary:     Effort: Pulmonary effort is normal.     Breath sounds: Normal breath sounds.  Abdominal:     General: Bowel sounds are normal. There is no distension.     Palpations: Abdomen is soft.  Skin:    General: Skin is warm and dry.   Neurological:     General: No focal deficit present.     Mental Status: She is alert and oriented to person, place, and time.           Assessment & Plan:

## 2019-07-03 NOTE — Patient Instructions (Signed)
Continue Advil as needed Stay hydrated If symptoms don't improve or worsen in the next week follow up with clinic for further evaluation

## 2019-07-10 DIAGNOSIS — Z01419 Encounter for gynecological examination (general) (routine) without abnormal findings: Secondary | ICD-10-CM | POA: Diagnosis not present

## 2019-07-10 DIAGNOSIS — Z131 Encounter for screening for diabetes mellitus: Secondary | ICD-10-CM | POA: Diagnosis not present

## 2019-07-10 DIAGNOSIS — Z0000 Encounter for general adult medical examination without abnormal findings: Secondary | ICD-10-CM | POA: Diagnosis not present

## 2019-07-10 DIAGNOSIS — Z23 Encounter for immunization: Secondary | ICD-10-CM | POA: Diagnosis not present

## 2019-07-10 DIAGNOSIS — Z13220 Encounter for screening for lipoid disorders: Secondary | ICD-10-CM | POA: Diagnosis not present

## 2019-07-10 DIAGNOSIS — Z1329 Encounter for screening for other suspected endocrine disorder: Secondary | ICD-10-CM | POA: Diagnosis not present

## 2019-07-10 DIAGNOSIS — Z130 Encounter for screening for diseases of the blood and blood-forming organs and certain disorders involving the immune mechanism: Secondary | ICD-10-CM | POA: Diagnosis not present

## 2019-07-10 DIAGNOSIS — N760 Acute vaginitis: Secondary | ICD-10-CM | POA: Diagnosis not present

## 2019-07-10 DIAGNOSIS — Z6831 Body mass index (BMI) 31.0-31.9, adult: Secondary | ICD-10-CM | POA: Diagnosis not present

## 2019-07-11 ENCOUNTER — Encounter: Payer: Self-pay | Admitting: Nurse Practitioner

## 2019-07-11 LAB — POCT RAPID STREP A (OFFICE): Rapid Strep A Screen: NEGATIVE

## 2019-10-09 DIAGNOSIS — L700 Acne vulgaris: Secondary | ICD-10-CM | POA: Diagnosis not present

## 2020-01-08 DIAGNOSIS — L700 Acne vulgaris: Secondary | ICD-10-CM | POA: Diagnosis not present

## 2020-01-29 DIAGNOSIS — H1045 Other chronic allergic conjunctivitis: Secondary | ICD-10-CM | POA: Diagnosis not present

## 2020-02-18 DIAGNOSIS — H1045 Other chronic allergic conjunctivitis: Secondary | ICD-10-CM | POA: Diagnosis not present

## 2020-07-19 DIAGNOSIS — Z3202 Encounter for pregnancy test, result negative: Secondary | ICD-10-CM | POA: Diagnosis not present

## 2020-07-19 DIAGNOSIS — Z30430 Encounter for insertion of intrauterine contraceptive device: Secondary | ICD-10-CM | POA: Diagnosis not present

## 2020-08-18 DIAGNOSIS — Z6832 Body mass index (BMI) 32.0-32.9, adult: Secondary | ICD-10-CM | POA: Diagnosis not present

## 2020-08-18 DIAGNOSIS — Z1329 Encounter for screening for other suspected endocrine disorder: Secondary | ICD-10-CM | POA: Diagnosis not present

## 2020-08-18 DIAGNOSIS — Z131 Encounter for screening for diabetes mellitus: Secondary | ICD-10-CM | POA: Diagnosis not present

## 2020-08-18 DIAGNOSIS — Z13220 Encounter for screening for lipoid disorders: Secondary | ICD-10-CM | POA: Diagnosis not present

## 2020-08-18 DIAGNOSIS — Z01419 Encounter for gynecological examination (general) (routine) without abnormal findings: Secondary | ICD-10-CM | POA: Diagnosis not present

## 2020-08-18 DIAGNOSIS — Z0000 Encounter for general adult medical examination without abnormal findings: Secondary | ICD-10-CM | POA: Diagnosis not present

## 2020-08-23 ENCOUNTER — Ambulatory Visit: Payer: Self-pay | Admitting: Nurse Practitioner

## 2020-08-23 ENCOUNTER — Encounter: Payer: Self-pay | Admitting: Nurse Practitioner

## 2020-08-23 ENCOUNTER — Other Ambulatory Visit: Payer: Self-pay

## 2020-08-23 VITALS — BP 150/106 | HR 76 | Temp 98.6°F | Resp 16 | Ht 65.0 in | Wt 193.0 lb

## 2020-08-23 DIAGNOSIS — R030 Elevated blood-pressure reading, without diagnosis of hypertension: Secondary | ICD-10-CM

## 2020-08-23 NOTE — Progress Notes (Signed)
   Subjective:    Patient ID: Jocelyn Griffin, female    DOB: 24-Mar-1991, 29 y.o.   MRN: [id number]  HPI  Here for  BP check as requested by her OBGYN who is the patient's main physician. She recently had labs done through their office as well ans was noted to have increased cholesterol. She does state that she has a family history of HTN.  Will work with OBGYN on weight loss and cholesterol rechecks.   Will be referred out for BP management.   Today's Vitals   08/23/20 1026  BP: (!) 150/106  Pulse: 76  Resp: 16  Temp: 98.6 F (37 C)  TempSrc: Oral  SpO2: 99%  Weight: 193 lb (87.5 kg)  Height: 5\' 5"  (1.651 m)   Body mass index is 32.12 kg/m.     Review of Systems  Constitutional: Negative.   HENT: Negative.   Neurological: Negative.        Objective:   Physical Exam Constitutional:      Appearance: Normal appearance.  Neurological:     Mental Status: She is alert and oriented to person, place, and time. Mental status is at baseline.  Psychiatric:        Mood and Affect: Mood normal.        Behavior: Behavior normal.        Thought Content: Thought content normal.        Judgment: Judgment normal.           Assessment & Plan:  Will forward note to PCP/OBGYN. Dr.  Patient informed we have a nutritionist available for consultation if she would like she will let Ernestina Penna know  Gave patient fax for future lab needs and [phone] number for PCP referral line.   Briefly discussed lifestyle changes for cholesterol and BP control

## 2020-09-15 DIAGNOSIS — Z20822 Contact with and (suspected) exposure to covid-19: Secondary | ICD-10-CM | POA: Diagnosis not present

## 2021-01-27 DIAGNOSIS — Z713 Dietary counseling and surveillance: Secondary | ICD-10-CM | POA: Diagnosis not present

## 2021-01-27 DIAGNOSIS — Z6828 Body mass index (BMI) 28.0-28.9, adult: Secondary | ICD-10-CM | POA: Diagnosis not present

## 2021-04-19 DIAGNOSIS — Z20822 Contact with and (suspected) exposure to covid-19: Secondary | ICD-10-CM | POA: Diagnosis not present

## 2021-05-10 DIAGNOSIS — Z6828 Body mass index (BMI) 28.0-28.9, adult: Secondary | ICD-10-CM | POA: Diagnosis not present

## 2021-05-10 DIAGNOSIS — Z713 Dietary counseling and surveillance: Secondary | ICD-10-CM | POA: Diagnosis not present

## 2021-06-22 ENCOUNTER — Ambulatory Visit
Admission: EM | Admit: 2021-06-22 | Discharge: 2021-06-22 | Disposition: A | Discharge status: Home / Self Care | Payer: BC Managed Care – PPO

## 2021-06-22 ENCOUNTER — Other Ambulatory Visit: Payer: Self-pay

## 2021-06-22 ENCOUNTER — Encounter: Payer: Self-pay | Admitting: Emergency Medicine

## 2021-06-22 DIAGNOSIS — B349 Viral infection, unspecified: Secondary | ICD-10-CM

## 2021-06-22 MED ORDER — BENZONATATE 100 MG PO CAPS: 100.0000 mg | ORAL_CAPSULE | Freq: Three times a day (TID) | ORAL | 0 refills | Status: DC

## 2021-06-22 MED ORDER — PREDNISONE 10 MG PO TABS: ORAL_TABLET | ORAL | 0 refills | Status: AC

## 2021-06-22 MED ORDER — ALBUTEROL SULFATE HFA 108 (90 BASE) MCG/ACT IN AERS: 1.0000 | INHALATION_SPRAY | Freq: Four times a day (QID) | RESPIRATORY_TRACT | 0 refills | Status: AC | PRN

## 2021-06-22 NOTE — ED Provider Notes (Signed)
CHIEF COMPLAINT:   Chief Complaint  Patient presents with   Cough   Sore Throat     SUBJECTIVE/HPI:   Cough Sore Throat  A very pleasant 30 y.o.Female presents today with hacking cough for the last 5 days.  Patient reports that children are sick at home.  Patient reports that everyone has tested negative for COVID-19 within her household.  She reports a sore throat and thinks that it is related to her constant coughing. Patient does not report any shortness of breath, chest pain, palpitations, visual changes, weakness, tingling, headache, nausea, vomiting, diarrhea, fever, chills.   has a past medical history of Asthma, Fetal arrhythmia affecting pregnancy, antepartum, Menorrhagia, Oligomenorrhea, and Postcoital UTI.  ROS:  Review of Systems  Respiratory:  Positive for cough.   See Subjective/HPI Medications, Allergies and Problem List personally reviewed in Epic today OBJECTIVE:   Vitals:   06/22/21 1005  BP: 123/85  Pulse: 70  Resp: 18  Temp: 99 F (37.2 C)  SpO2: 97%    Physical Exam   General: Appears well-developed and well-nourished. No acute distress.  HEENT Head: Normocephalic and atraumatic.   Ears: Hearing grossly intact, no drainage or visible deformity.  Nose: No nasal deviation.   Mouth/Throat: No stridor or tracheal deviation.  Non erythematous posterior pharynx noted with clear drainage present.  No white patchy exudate noted. Eyes: Conjunctivae and EOM are normal. No eye drainage or scleral icterus bilaterally.  Neck: Normal range of motion, neck is supple.  Cardiovascular: Normal rate. Regular rhythm; no murmurs, gallops, or rubs.  Pulm/Chest: No respiratory distress.  Inspiratory wheezing noted to left upper lobe with right upper lobe and bilateral lower lobes CTA. Neurological: Alert and oriented to person, place, and time.  Skin: Skin is warm and dry.  No rashes, lesions, abrasions or bruising noted to skin.   Psychiatric: Normal mood, affect,  behavior, and thought content.   Vital signs and nursing note reviewed.   Patient stable and cooperative with examination. PROCEDURES:    LABS/X-RAYS/EKG/MEDS:   No results found for any visits on 06/22/21.  MEDICAL DECISION MAKING:   Patient presents with hacking cough for the last 5 days.  Patient reports that children are sick at home.  Patient reports that everyone has tested negative for COVID-19 within her household.  She reports a sore throat and thinks that it is related to her constant coughing. Patient does not report any shortness of breath, chest pain, palpitations, visual changes, weakness, tingling, headache, nausea, vomiting, diarrhea, fever, chills.  Given symptoms along with assessment findings, likely viral illness.  Rx'd prednisone, Tessalon Perles and an albuterol inhaler to the patient's preferred pharmacy.  No concern at this time for any underlying bacterial etiology which would require antibiotics.  Advised to rest, push fluids, use Tylenol, ibuprofen and Mucinex as needed.  Return to clinic for new onset fever, difficulty breathing, chest pain, symptoms lasting longer than 3 to 4 weeks or bloody sputum.  Work note provided.  Patient verbalized understanding and agreed with treatment plan.  Patient stable upon discharge. ASSESSMENT/PLAN:  1. Viral illness - predniSONE (DELTASONE) 10 MG tablet; Take 6 tablets (60 mg total) by mouth daily for 1 day, THEN 5 tablets (50 mg total) daily for 1 day, THEN 4 tablets (40 mg total) daily for 1 day, THEN 3 tablets (30 mg total) daily for 1 day, THEN 2 tablets (20 mg total) daily for 1 day, THEN 1 tablet (10 mg total) daily for 1 day.  Dispense:  21 tablet; Refill: 0 - albuterol (VENTOLIN HFA) 108 (90 Base) MCG/ACT inhaler; Inhale 1-2 puffs into the lungs every 6 (six) hours as needed (cough).  Dispense: 6.7 g; Refill: 0 - benzonatate (TESSALON) 100 MG capsule; Take 1 capsule (100 mg total) by mouth every 8 (eight) hours.  Dispense: 21  capsule; Refill: 0 Instructions about new medications and side effects provided.  Plan:   Discharge Instructions      Take prednisone and Tessalon Perles as prescribed.  Use albuterol inhaler as directed.  Rest, push lots of fluids (especially water), and utilize supportive care for symptoms. You may take take acetaminophen (Tylenol) every 4-6 hours or ibuprofen every 6-8 hours for muscle pain, joint pain, headaches. Mucinex (guaifenesin) may be taken over the counter for cough as needed and can loosen phlegm. Please read the instructions and take as directed. Saline nasal sprays to rinse congestion can help as well. Warm tea with lemon and honey can sooth sore throat and cough, as can cough drops.  Return to clinic for new-onset fever, difficulty breathing, chest pain, symptoms lasting >3 to 4 weeks, or bloody sputum.          Amalia Greenhouse, FNP 06/22/21 1034

## 2021-06-22 NOTE — Discharge Instructions (Addendum)
Take prednisone and Tessalon Perles as prescribed.  Use albuterol inhaler as directed.  Rest, push lots of fluids (especially water), and utilize supportive care for symptoms. You may take take acetaminophen (Tylenol) every 4-6 hours or ibuprofen every 6-8 hours for muscle pain, joint pain, headaches. Mucinex (guaifenesin) may be taken over the counter for cough as needed and can loosen phlegm. Please read the instructions and take as directed. Saline nasal sprays to rinse congestion can help as well. Warm tea with lemon and honey can sooth sore throat and cough, as can cough drops.  Return to clinic for new-onset fever, difficulty breathing, chest pain, symptoms lasting >3 to 4 weeks, or bloody sputum.

## 2021-06-22 NOTE — ED Triage Notes (Signed)
Patient here with dry hacking cough x 5 days. Children are sick at home. Everyone has tested negative for COVID. Sore throat feels as though it is just irritation from coughing.

## 2021-07-06 ENCOUNTER — Ambulatory Visit: Payer: BC Managed Care – PPO

## 2021-07-20 DIAGNOSIS — Z23 Encounter for immunization: Secondary | ICD-10-CM | POA: Diagnosis not present

## 2021-08-25 DIAGNOSIS — Z13220 Encounter for screening for lipoid disorders: Secondary | ICD-10-CM | POA: Diagnosis not present

## 2021-08-25 DIAGNOSIS — Z6830 Body mass index (BMI) 30.0-30.9, adult: Secondary | ICD-10-CM | POA: Diagnosis not present

## 2021-08-25 DIAGNOSIS — Z713 Dietary counseling and surveillance: Secondary | ICD-10-CM | POA: Diagnosis not present

## 2021-08-25 DIAGNOSIS — Z01419 Encounter for gynecological examination (general) (routine) without abnormal findings: Secondary | ICD-10-CM | POA: Diagnosis not present

## 2021-08-25 DIAGNOSIS — Z131 Encounter for screening for diabetes mellitus: Secondary | ICD-10-CM | POA: Diagnosis not present

## 2021-08-25 DIAGNOSIS — Z0000 Encounter for general adult medical examination without abnormal findings: Secondary | ICD-10-CM | POA: Diagnosis not present

## 2021-08-25 DIAGNOSIS — Z1329 Encounter for screening for other suspected endocrine disorder: Secondary | ICD-10-CM | POA: Diagnosis not present

## 2021-08-25 DIAGNOSIS — Z124 Encounter for screening for malignant neoplasm of cervix: Secondary | ICD-10-CM | POA: Diagnosis not present

## 2021-12-01 DIAGNOSIS — Z713 Dietary counseling and surveillance: Secondary | ICD-10-CM | POA: Diagnosis not present

## 2022-01-03 DIAGNOSIS — Z6827 Body mass index (BMI) 27.0-27.9, adult: Secondary | ICD-10-CM | POA: Diagnosis not present

## 2022-01-03 DIAGNOSIS — Z713 Dietary counseling and surveillance: Secondary | ICD-10-CM | POA: Diagnosis not present

## 2022-03-22 DIAGNOSIS — Z6827 Body mass index (BMI) 27.0-27.9, adult: Secondary | ICD-10-CM | POA: Diagnosis not present

## 2022-03-22 DIAGNOSIS — Z713 Dietary counseling and surveillance: Secondary | ICD-10-CM | POA: Diagnosis not present

## 2022-07-11 DIAGNOSIS — Z23 Encounter for immunization: Secondary | ICD-10-CM | POA: Diagnosis not present

## 2022-07-17 DIAGNOSIS — Z6827 Body mass index (BMI) 27.0-27.9, adult: Secondary | ICD-10-CM | POA: Diagnosis not present

## 2022-07-17 DIAGNOSIS — Z713 Dietary counseling and surveillance: Secondary | ICD-10-CM | POA: Diagnosis not present

## 2022-07-31 ENCOUNTER — Ambulatory Visit (INDEPENDENT_AMBULATORY_CARE_PROVIDER_SITE_OTHER): Payer: Self-pay | Admitting: Physician Assistant

## 2022-07-31 VITALS — BP 120/78 | HR 88 | Temp 97.9°F | Ht 64.0 in | Wt 168.0 lb

## 2022-07-31 DIAGNOSIS — R058 Other specified cough: Secondary | ICD-10-CM

## 2022-07-31 LAB — POCT INFLUENZA A/B
Influenza A, POC: NEGATIVE
Influenza B, POC: NEGATIVE

## 2022-07-31 LAB — POC COVID19 BINAXNOW: SARS Coronavirus 2 Ag: NEGATIVE

## 2022-07-31 MED ORDER — BENZONATATE 100 MG PO CAPS: 200.0000 mg | ORAL_CAPSULE | Freq: Three times a day (TID) | ORAL | 0 refills | Status: DC | PRN

## 2022-07-31 NOTE — Progress Notes (Signed)
[facility] [address] [place], [place] [address]   Office Visit Note  Patient Name: Jocelyn Griffin Date of Birth [id number]  Medical Record number [id number]  Date of Service: 07/31/2022  Chief Complaint  Patient presents with   Cough    Cough that she has had for over a week. Body aches this weekend. No ST, fever or congestion. COVID was a couple weeks ago. Dry cough. Has been using inhaler.      31 y/o F presents to the clinic for c/o cough x 2+weeks. +body aches over the weekend, but feels better. Cough is lingering for long time. Has h/o asthma exacerbation with allergens and noticed she is wheezing sometimes. Cough is worse at night. No other symptoms currently including fever, CP, SOB, or sore throat.   Cough Associated symptoms include postnasal drip. Pertinent negatives include no sore throat.      Current Medication:  Outpatient Encounter Medications as of 07/31/2022  Medication Sig   albuterol (VENTOLIN HFA) 108 (90 Base) MCG/ACT inhaler Inhale 1-2 puffs into the lungs every 6 (six) hours as needed (cough).   benzonatate (TESSALON) 100 MG capsule Take 2 capsules (200 mg total) by mouth 3 (three) times daily as needed for cough.   QSYMIA 7.5-46 MG CP24 Take 1 capsule by mouth daily.   [DISCONTINUED] benzonatate (TESSALON) 100 MG capsule Take 1 capsule (100 mg total) by mouth every 8 (eight) hours. (Patient not taking: Reported on 07/31/2022)   No facility-administered encounter medications on file as of 07/31/2022.      Medical History: Past Medical History:  Diagnosis Date   Asthma    last inhaler use a yr ago   Fetal arrhythmia affecting pregnancy, antepartum    documented PACS by cardiologist   Menorrhagia    Oligomenorrhea    Postcoital UTI      Vital Signs: BP 120/78 (BP Location: Left Arm, Patient Position: Sitting, Cuff Size: Normal)   Pulse 88   Temp 97.9 F (36.6 C) (Tympanic)   Ht 5\' 4"  (1.626 m)   Wt 168 lb (76.2 kg)   SpO2  98%   BMI 28.84 kg/m    Review of Systems  Constitutional: Negative.   HENT:  Positive for congestion and postnasal drip. Negative for sinus pressure, sinus pain, sore throat and trouble swallowing.   Respiratory:  Positive for cough.   Cardiovascular: Negative.   Neurological: Negative.     Physical Exam Constitutional:      Appearance: Normal appearance.  HENT:     Head: Atraumatic.     Right Ear: Tympanic membrane, ear canal and external ear normal.     Left Ear: Tympanic membrane, ear canal and external ear normal.     Nose: Nose normal.     Mouth/Throat:     Mouth: Mucous membranes are moist.     Pharynx: Oropharynx is clear.  Eyes:     Extraocular Movements: Extraocular movements intact.  Cardiovascular:     Rate and Rhythm: Normal rate and regular rhythm.  Pulmonary:     Effort: Pulmonary effort is normal.     Breath sounds: Normal breath sounds.  Musculoskeletal:     Cervical back: Neck supple.  Skin:    General: Skin is warm.  Neurological:     Mental Status: She is alert.  Psychiatric:        Mood and Affect: Mood normal.        Behavior: Behavior normal.  Thought Content: Thought content normal.        Judgment: Judgment normal.       Assessment/Plan:  1. Post-viral cough syndrome - POCT Influenza A/B - POC COVID-19 BinaxNow - benzonatate (TESSALON) 100 MG capsule; Take 2 capsules (200 mg total) by mouth 3 (three) times daily as needed for cough.  Dispense: 30 capsule; Refill: 0  Increase fluids Take otc oral decongestant ie Sudafed and Allegra Take Benzonatate for cough suppression. Continue with Albuterol inhaler as previously prescribed  Continue to watch for worsening symptoms Briefly discussed oral steroids if above treatment doesn't improve her symptoms. She verbalized understanding. RTC prn Pt verbalized understanding and in agreement.   General Counseling: munirah doerner understanding of the findings of todays visit and agrees  with plan of treatment. I have discussed any further diagnostic evaluation that may be needed or ordered today. We also reviewed her medications today. she has been encouraged to call the office with any questions or concerns that should arise related to todays visit.    Time spent:20 Minutes    Gilberto Better, [place] Physician Assistant

## 2022-08-01 ENCOUNTER — Ambulatory Visit
Admission: EM | Admit: 2022-08-01 | Discharge: 2022-08-01 | Disposition: A | Discharge status: Home / Self Care | Payer: BC Managed Care – PPO | Attending: Emergency Medicine | Admitting: Emergency Medicine

## 2022-08-01 DIAGNOSIS — J45901 Unspecified asthma with (acute) exacerbation: Secondary | ICD-10-CM

## 2022-08-01 DIAGNOSIS — R059 Cough, unspecified: Secondary | ICD-10-CM

## 2022-08-01 MED ORDER — PROMETHAZINE-DM 6.25-15 MG/5ML PO SYRP: 5.0000 mL | ORAL_SOLUTION | Freq: Four times a day (QID) | ORAL | 0 refills | Status: DC | PRN

## 2022-08-01 MED ORDER — PREDNISONE 10 MG PO TABS: 40.0000 mg | ORAL_TABLET | Freq: Every day | ORAL | 0 refills | Status: AC

## 2022-08-01 NOTE — ED Provider Notes (Signed)
Jocelyn Griffin    CSN: [id number] Arrival date & time: 08/01/22  1858      History   Chief Complaint Chief Complaint  Patient presents with   Cough    HPI Jocelyn Griffin is a 31 y.o. female.  Patient presents with 3-week history of nonproductive cough, worse since last night and unable to sleep well due to cough.  No fever, rash, sore throat, shortness of breath, vomiting, diarrhea, or other symptoms.  Patient was seen at [facility] yesterday; diagnosed with postviral cough syndrome; negative COVID, negative flu; treated with Tessalon Perles.  Her medical history includes asthma.  She denies current pregnancy or breastfeeding.     The history is provided by the patient and medical records.    Past Medical History:  Diagnosis Date   Asthma    last inhaler use a yr ago   Fetal arrhythmia affecting pregnancy, antepartum    documented PACS by cardiologist   Menorrhagia    Oligomenorrhea    Postcoital UTI     Patient Active Problem List   Diagnosis Date Noted   Postpartum care following cesarean delivery (11/26) 08/14/2017   Previous cesarean section 08/13/2017   Cesarean delivery  08/13/2017   Status post repeat low transverse cesarean section 08/13/2017    Past Surgical History:  Procedure Laterality Date   CESAREAN SECTION N/A 12/14/2014   Procedure: CESAREAN SECTION;  Surgeon: Noland Fordyce, MD;  Location: [facility];  Service: Obstetrics;  Laterality: N/A;   CESAREAN SECTION N/A 08/13/2017   Procedure: Repeat CESAREAN SECTION;  Surgeon: Noland Fordyce, MD;  Location: [facility];  Service: Obstetrics;  Laterality: N/A;  EDD: 08/17/17 Time ok per Izora Ribas.   WISDOM TOOTH EXTRACTION  01/17/11    OB History     Gravida  2   Para  2   Term  2   Preterm      AB      Living  2      SAB      IAB      Ectopic      Multiple  0   Live Births  2            Home Medications    Prior to Admission medications   Medication Sig  Start Date End Date Taking? Authorizing Provider  predniSONE (DELTASONE) 10 MG tablet Take 4 tablets (40 mg total) by mouth daily for 5 days. 08/01/22 08/06/22 Yes Mickie Bail, NP  promethazine-dextromethorphan (PROMETHAZINE-DM) 6.25-15 MG/5ML syrup Take 5 mLs by mouth 4 (four) times daily as needed for cough. 08/01/22  Yes Mickie Bail, NP  albuterol (VENTOLIN HFA) 108 (90 Base) MCG/ACT inhaler Inhale 1-2 puffs into the lungs every 6 (six) hours as needed (cough). 06/22/21   Boddu, Belenda Cruise, FNP  benzonatate (TESSALON) 100 MG capsule Take 2 capsules (200 mg total) by mouth 3 (three) times daily as needed for cough. 07/31/22   Gandhi, Safal, PA-C  QSYMIA 7.5-46 MG CP24 Take 1 capsule by mouth daily. 06/06/21   [provider]    Family History Family History  Problem Relation Age of Onset   Hypertension Father    Diabetes Maternal Grandmother    Hypertension Maternal Grandmother    Colon cancer Maternal Grandmother 74   Cervical cancer Paternal Grandmother 16   Diabetes Maternal Aunt     Social History Social History   Tobacco Use   Smoking status: Never   Smokeless tobacco: Never  Substance Use  Topics   Alcohol use: No   Drug use: No     Allergies   Patient has no known allergies.   Review of Systems Review of Systems  Constitutional:  Negative for chills and fever.  HENT:  Negative for ear pain and sore throat.   Respiratory:  Positive for cough. Negative for shortness of breath.   Cardiovascular:  Negative for chest pain and palpitations.  Gastrointestinal:  Negative for diarrhea and vomiting.  Skin:  Negative for rash.  All other systems reviewed and are negative.    Physical Exam Triage Vital Signs ED Triage Vitals  Enc Vitals Group     BP 08/01/22 1924 136/80     Pulse Rate 08/01/22 1918 83     Resp 08/01/22 1918 18     Temp 08/01/22 1918 97.9 F (36.6 C)     Temp src --      SpO2 08/01/22 1918 98 %     Weight 08/01/22 1923 168 lb (76.2 kg)      Height 08/01/22 1923 5\' 4"  (1.626 m)     Head Circumference --      Peak Flow --      Pain Score 08/01/22 1921 0     Pain Loc --      Pain Edu? --      Excl. in [place]? --    No data found.  Updated Vital Signs BP 136/80   Pulse 83   Temp 97.9 F (36.6 C)   Resp 18   Ht 5\' 4"  (1.626 m)   Wt 168 lb (76.2 kg)   SpO2 98%   BMI 28.84 kg/m   Visual Acuity Right Eye Distance:   Left Eye Distance:   Bilateral Distance:    Right Eye Near:   Left Eye Near:    Bilateral Near:     Physical Exam Vitals and nursing note reviewed.  Constitutional:      General: She is not in acute distress.    Appearance: Normal appearance. She is well-developed. She is not ill-appearing.  HENT:     Right Ear: Tympanic membrane normal.     Left Ear: Tympanic membrane normal.     Nose: Nose normal.     Mouth/Throat:     Mouth: Mucous membranes are moist.     Pharynx: Oropharynx is clear.  Cardiovascular:     Rate and Rhythm: Normal rate and regular rhythm.     Heart sounds: Normal heart sounds.  Pulmonary:     Effort: Pulmonary effort is normal. No respiratory distress.     Breath sounds: Normal breath sounds.     Comments: Frequent nonproductive cough during exam. Musculoskeletal:     Cervical back: Neck supple.  Skin:    General: Skin is warm and dry.  Neurological:     Mental Status: She is alert.  Psychiatric:        Mood and Affect: Mood normal.        Behavior: Behavior normal.      UC Treatments / Results  Labs (all labs ordered are listed, but only abnormal results are displayed) Labs Reviewed - No data to display  EKG   Radiology No results found.  Procedures Procedures (including critical care time)  Medications Ordered in UC Medications - No data to display  Initial Impression / Assessment and Plan / UC Course  I have reviewed the triage vital signs and the nursing notes.  Pertinent labs & imaging results that were available during  my care of the patient  were reviewed by me and considered in my medical decision making (see chart for details).    Asthma exacerbation, cough.  O2 sat 98% on room air.  No acute respiratory distress but frequent dry nonproductive cough.  Instructed patient to continue using her albuterol inhaler.  Treating with prednisone.  Also treating cough with Promethazine DM; precautions for drowsiness with this medication discussed.  Instructed patient to follow up with her PCP if her symptoms are not improving.  She agrees to plan of care.    Final Clinical Impressions(s) / UC Diagnoses   Final diagnoses:  Asthma with acute exacerbation, unspecified asthma severity, unspecified whether persistent  Cough, unspecified type     Discharge Instructions      Continue to use your albuterol inhaler as directed.  Take the prednisone as directed.    Take the Promethazine DM as directed for cough.  Do not drive, operate machinery, drink alcohol, or perform dangerous activities while taking this medication as it may cause drowsiness.  Follow up with your primary care provider if your symptoms are not improving.        ED Prescriptions     Medication Sig Dispense Auth. Provider   predniSONE (DELTASONE) 10 MG tablet Take 4 tablets (40 mg total) by mouth daily for 5 days. 20 tablet Sharion Balloon, NP   promethazine-dextromethorphan (PROMETHAZINE-DM) 6.25-15 MG/5ML syrup Take 5 mLs by mouth 4 (four) times daily as needed for cough. 118 mL Sharion Balloon, NP      PDMP not reviewed this encounter.   Sharion Balloon, NP 08/01/22 1949

## 2022-08-01 NOTE — ED Triage Notes (Signed)
Patient to Urgent Care with complaints of persistent cough x3 weeks, unable to sleep. Cough is dry and unproductive. Denies any known fevers.   Was seen at [facility] yesterday and recommended to take otc decongestant/ benzonatate/ albuterol/ zyrtec. Negative covid and flu tests. Reports today was bad and had difficulty getting through her work day due to coughing.

## 2022-08-01 NOTE — Discharge Instructions (Signed)
Continue to use your albuterol inhaler as directed.  Take the prednisone as directed.    Take the Promethazine DM as directed for cough.  Do not drive, operate machinery, drink alcohol, or perform dangerous activities while taking this medication as it may cause drowsiness.  Follow up with your primary care provider if your symptoms are not improving.

## 2022-08-12 DIAGNOSIS — J069 Acute upper respiratory infection, unspecified: Secondary | ICD-10-CM | POA: Diagnosis not present

## 2022-08-18 ENCOUNTER — Ambulatory Visit
Admission: EM | Admit: 2022-08-18 | Discharge: 2022-08-18 | Disposition: A | Discharge status: Home / Self Care | Payer: BC Managed Care – PPO

## 2022-08-18 ENCOUNTER — Ambulatory Visit (INDEPENDENT_AMBULATORY_CARE_PROVIDER_SITE_OTHER): Payer: BC Managed Care – PPO

## 2022-08-18 DIAGNOSIS — R052 Subacute cough: Secondary | ICD-10-CM | POA: Diagnosis not present

## 2022-08-18 DIAGNOSIS — R059 Cough, unspecified: Secondary | ICD-10-CM | POA: Diagnosis not present

## 2022-08-18 DIAGNOSIS — R051 Acute cough: Secondary | ICD-10-CM

## 2022-08-18 NOTE — Discharge Instructions (Addendum)
Your chest xray is normal.  Use your albuterol inhaler as directed.  Take Tylenol or ibuprofen as needed for discomfort.  Follow up with your primary care provider if your symptoms are not improving.

## 2022-08-18 NOTE — ED Provider Notes (Signed)
Jocelyn Griffin    CSN: [id number] Arrival date & time: 08/18/22  1509      History   Chief Complaint Chief Complaint  Patient presents with   Cough    Pain - Entered by patient    HPI Jocelyn Griffin is a 31 y.o. female.  Accompanied by her husband, patient presents with cough x 6 weeks.  Her right side hurts from coughing.  She requests a chest xray.  No fever, shortness of breath, or other symptoms.  Last used albuterol inhaler 2 days ago.  She has an appointment with an allergist this week.  Patient was seen here on 08/01/2022; diagnosed with asthma exacerbation and cough; treated with prednisone and Promethazine DM.  The history is provided by the patient, the spouse and medical records.    Past Medical History:  Diagnosis Date   Asthma    last inhaler use a yr ago   Fetal arrhythmia affecting pregnancy, antepartum    documented PACS by cardiologist   Menorrhagia    Oligomenorrhea    Postcoital UTI     Patient Active Problem List   Diagnosis Date Noted   Cough 08/18/2022   Postpartum care following cesarean delivery (11/26) 08/14/2017   Previous cesarean section 08/13/2017   Cesarean delivery  08/13/2017   Status post repeat low transverse cesarean section 08/13/2017    Past Surgical History:  Procedure Laterality Date   CESAREAN SECTION N/A 12/14/2014   Procedure: CESAREAN SECTION;  Surgeon: Noland Fordyce, MD;  Location: [facility];  Service: Obstetrics;  Laterality: N/A;   CESAREAN SECTION N/A 08/13/2017   Procedure: Repeat CESAREAN SECTION;  Surgeon: Noland Fordyce, MD;  Location: [facility];  Service: Obstetrics;  Laterality: N/A;  EDD: 08/17/17 Time ok per Izora Ribas.   WISDOM TOOTH EXTRACTION  01/17/11    OB History     Gravida  2   Para  2   Term  2   Preterm      AB      Living  2      SAB      IAB      Ectopic      Multiple  0   Live Births  2            Home Medications    Prior to Admission medications    Medication Sig Start Date End Date Taking? Authorizing Provider  ADVAIR DISKUS 250-50 MCG/ACT AEPB 1 puff 2 (two) times daily. 08/12/22  Yes [provider]  albuterol (VENTOLIN HFA) 108 (90 Base) MCG/ACT inhaler Inhale 1-2 puffs into the lungs every 6 (six) hours as needed (cough). 06/22/21   Boddu, Belenda Cruise, FNP  benzonatate (TESSALON) 100 MG capsule Take 2 capsules (200 mg total) by mouth 3 (three) times daily as needed for cough. 07/31/22   Gilberto Better, PA-C  promethazine-dextromethorphan (PROMETHAZINE-DM) 6.25-15 MG/5ML syrup Take 5 mLs by mouth 4 (four) times daily as needed for cough. 08/01/22   Mickie Bail, NP  QSYMIA 7.5-46 MG CP24 Take 1 capsule by mouth daily. 06/06/21   [provider]    Family History Family History  Problem Relation Age of Onset   Hypertension Father    Diabetes Maternal Grandmother    Hypertension Maternal Grandmother    Colon cancer Maternal Grandmother 74   Cervical cancer Paternal Grandmother 18   Diabetes Maternal Aunt     Social History Social History   Tobacco Use   Smoking status: Never   Smokeless  tobacco: Never  Substance Use Topics   Alcohol use: No   Drug use: No     Allergies   Patient has no known allergies.   Review of Systems Review of Systems  Constitutional:  Negative for chills and fever.  HENT:  Negative for ear pain and sore throat.   Respiratory:  Positive for cough. Negative for shortness of breath.   Cardiovascular:  Negative for chest pain and palpitations.  Skin:  Negative for rash.  All other systems reviewed and are negative.    Physical Exam Triage Vital Signs ED Triage Vitals  Enc Vitals Group     BP      Pulse      Resp      Temp      Temp src      SpO2      Weight      Height      Head Circumference      Peak Flow      Pain Score      Pain Loc      Pain Edu?      Excl. in GC?    No data found.  Updated Vital Signs BP 123/85   Pulse 100   Temp 98.1 F (36.7 C)    Resp 18   SpO2 98%   Visual Acuity Right Eye Distance:   Left Eye Distance:   Bilateral Distance:    Right Eye Near:   Left Eye Near:    Bilateral Near:     Physical Exam Vitals and nursing note reviewed.  Constitutional:      General: She is not in acute distress.    Appearance: Normal appearance. She is well-developed. She is not ill-appearing.  HENT:     Right Ear: Tympanic membrane normal.     Left Ear: Tympanic membrane normal.     Nose: Nose normal.     Mouth/Throat:     Mouth: Mucous membranes are moist.     Pharynx: Oropharynx is clear.  Cardiovascular:     Rate and Rhythm: Normal rate and regular rhythm.     Heart sounds: Normal heart sounds.  Pulmonary:     Effort: Pulmonary effort is normal. No respiratory distress.     Breath sounds: Normal breath sounds. No wheezing, rhonchi or rales.  Musculoskeletal:     Cervical back: Neck supple.  Skin:    General: Skin is warm and dry.  Neurological:     Mental Status: She is alert.  Psychiatric:        Mood and Affect: Mood normal.        Behavior: Behavior normal.      UC Treatments / Results  Labs (all labs ordered are listed, but only abnormal results are displayed) Labs Reviewed - No data to display  EKG   Radiology DG Chest 2 View  Result Date: 08/18/2022 CLINICAL DATA:  cough EXAM: CHEST - 2 VIEW COMPARISON:  None Available. FINDINGS: Cardiac silhouette is unremarkable. No pneumothorax or pleural effusion. The lungs are clear. The visualized skeletal structures are unremarkable. IMPRESSION: No acute cardiopulmonary process. Electronically Signed   By: Layla Maw M.D.   On: 08/18/2022 15:44    Procedures Procedures (including critical care time)  Medications Ordered in UC Medications - No data to display  Initial Impression / Assessment and Plan / UC Course  I have reviewed the triage vital signs and the nursing notes.  Pertinent labs & imaging results that were available during  my care of  the patient were reviewed by me and considered in my medical decision making (see chart for details).   Cough.  Lungs are clear, no respiratory distress, O2 sat 98% on room air.  VSS.  CXR negative.  Instructed patient to use her albuterol inhaler as directed.  Discussed Tylenol or ibuprofen as needed for discomfort.  Instructed her to follow-up with her PCP.  Encouraged her to follow-up with the allergist as scheduled this week.  Education provided on cough.  She agrees to plan of care.    Final Clinical Impressions(s) / UC Diagnoses   Final diagnoses:  Subacute cough     Discharge Instructions      Your chest xray is normal.  Use your albuterol inhaler as directed.  Take Tylenol or ibuprofen as needed for discomfort.  Follow up with your primary care provider if your symptoms are not improving.        ED Prescriptions   None    PDMP not reviewed this encounter.   Mickie Bail, NP 08/18/22 [phone]

## 2022-08-18 NOTE — ED Triage Notes (Addendum)
Patient to Urgent Care with complaints of persistent and wet cough x6 weeks. Reports pain when coughing. Reports cough is productive with clear yellow thick mucus. Denies any known fevers. Requests a chest x-ray.   Has taken benzonatate/ promethazine-DM. Had some relief. Also was prescribed advair with no improvement.

## 2022-08-22 DIAGNOSIS — J3089 Other allergic rhinitis: Secondary | ICD-10-CM | POA: Diagnosis not present

## 2022-08-22 DIAGNOSIS — J4530 Mild persistent asthma, uncomplicated: Secondary | ICD-10-CM | POA: Diagnosis not present

## 2022-08-22 DIAGNOSIS — H1045 Other chronic allergic conjunctivitis: Secondary | ICD-10-CM | POA: Diagnosis not present

## 2022-08-29 DIAGNOSIS — J3089 Other allergic rhinitis: Secondary | ICD-10-CM | POA: Diagnosis not present

## 2022-08-29 DIAGNOSIS — J3081 Allergic rhinitis due to animal (cat) (dog) hair and dander: Secondary | ICD-10-CM | POA: Diagnosis not present

## 2022-08-30 DIAGNOSIS — J3081 Allergic rhinitis due to animal (cat) (dog) hair and dander: Secondary | ICD-10-CM | POA: Diagnosis not present

## 2022-10-18 DIAGNOSIS — Z6827 Body mass index (BMI) 27.0-27.9, adult: Secondary | ICD-10-CM | POA: Diagnosis not present

## 2022-10-18 DIAGNOSIS — E669 Obesity, unspecified: Secondary | ICD-10-CM | POA: Diagnosis not present

## 2022-10-18 DIAGNOSIS — Z01411 Encounter for gynecological examination (general) (routine) with abnormal findings: Secondary | ICD-10-CM | POA: Diagnosis not present

## 2022-11-03 ENCOUNTER — Encounter: Payer: Self-pay | Admitting: Family

## 2022-11-03 ENCOUNTER — Ambulatory Visit: Payer: BC Managed Care – PPO | Admitting: Family

## 2022-11-03 VITALS — BP 122/82 | HR 92 | Temp 97.7°F | Ht 64.0 in | Wt 168.4 lb

## 2022-11-03 DIAGNOSIS — J4530 Mild persistent asthma, uncomplicated: Secondary | ICD-10-CM | POA: Diagnosis not present

## 2022-11-03 DIAGNOSIS — Z131 Encounter for screening for diabetes mellitus: Secondary | ICD-10-CM | POA: Diagnosis not present

## 2022-11-03 DIAGNOSIS — Z0000 Encounter for general adult medical examination without abnormal findings: Secondary | ICD-10-CM | POA: Diagnosis not present

## 2022-11-03 DIAGNOSIS — Z6828 Body mass index (BMI) 28.0-28.9, adult: Secondary | ICD-10-CM | POA: Insufficient documentation

## 2022-11-03 DIAGNOSIS — J45909 Unspecified asthma, uncomplicated: Secondary | ICD-10-CM | POA: Insufficient documentation

## 2022-11-03 DIAGNOSIS — J3089 Other allergic rhinitis: Secondary | ICD-10-CM

## 2022-11-03 DIAGNOSIS — E782 Mixed hyperlipidemia: Secondary | ICD-10-CM

## 2022-11-03 DIAGNOSIS — Z136 Encounter for screening for cardiovascular disorders: Secondary | ICD-10-CM

## 2022-11-03 DIAGNOSIS — E663 Overweight: Secondary | ICD-10-CM | POA: Diagnosis not present

## 2022-11-03 DIAGNOSIS — R7303 Prediabetes: Secondary | ICD-10-CM

## 2022-11-03 LAB — LIPID PANEL
Cholesterol: 174 mg/dL (ref 0–200)
HDL: 43.5 mg/dL (ref >39.00)
LDL Cholesterol: 101 mg/dL — ABNORMAL HIGH (ref 0–99)
NonHDL: 130.2
Total CHOL/HDL Ratio: 4
Triglycerides: 144 mg/dL (ref 0.0–149.0)
VLDL: 28.8 mg/dL (ref 0.0–40.0)

## 2022-11-03 LAB — CBC
HCT: 42.9 % (ref 36.0–46.0)
Hemoglobin: 14.9 g/dL (ref 12.0–15.0)
MCHC: 34.7 g/dL (ref 30.0–36.0)
MCV: 88.3 fl (ref 78.0–100.0)
Platelets: 316 10*3/uL (ref 150.0–400.0)
RBC: 4.85 Mil/uL (ref 3.87–5.11)
RDW: 13.2 % (ref 11.5–15.5)
WBC: 5.1 10*3/uL (ref 4.0–10.5)

## 2022-11-03 LAB — BASIC METABOLIC PANEL
BUN: 12 mg/dL (ref 6–23)
CO2: 26 mEq/L (ref 19–32)
Calcium: 9.7 mg/dL (ref 8.4–10.5)
Chloride: 107 mEq/L (ref 96–112)
Creatinine, Ser: 0.75 mg/dL (ref 0.40–1.20)
GFR: 106.17 mL/min (ref >60.00)
Glucose, Bld: 79 mg/dL (ref 70–99)
Potassium: 3.8 mEq/L (ref 3.5–5.1)
Sodium: 139 mEq/L (ref 135–145)

## 2022-11-03 LAB — HEMOGLOBIN A1C: Hgb A1c MFr Bld: 5.2 % (ref 4.6–6.5)

## 2022-11-03 NOTE — Assessment & Plan Note (Signed)
Ordered lipid panel, pending results. Work on low cholesterol diet and exercise as tolerated ? ?

## 2022-11-03 NOTE — Assessment & Plan Note (Signed)
Continue with allergist and medications as prescribed

## 2022-11-03 NOTE — Patient Instructions (Signed)
  Stop by the lab prior to leaving today. I will notify you of your results once received.   Recommendations on keeping yourself healthy:  - Exercise at least 30-45 minutes a day, 3-4 days a week.  - Eat a low-fat diet with lots of fruits and vegetables, up to 7-9 servings per day.  - Seatbelts can save your life. Wear them always.  - Smoke detectors on every level of your home, check batteries every year.  - Eye Doctor - have an eye exam every 1-2 years  - Safe sex - if you may be exposed to STDs, use a condom.  - Alcohol -  If you drink, do it moderately, less than 2 drinks per day.  - Health Care Power of Attorney. Choose someone to speak for you if you are not able.  - Depression is common in our stressful world.If you're feeling down or losing interest in things you normally enjoy, please come in for a visit.  - Violence - If anyone is threatening or hurting you, please call immediately.  Due to recent changes in healthcare laws, you may see results of your imaging and/or laboratory studies on MyChart before I have had a chance to review them.  I understand that in some cases there may be results that are confusing or concerning to you. Please understand that not all results are received at the same time and often I may need to interpret multiple results in order to provide you with the best plan of care or course of treatment. Therefore, I ask that you please give me 2 business days to thoroughly review all your results before contacting my office for clarification. Should we see a critical lab result, you will be contacted sooner.   I will see you again in one year for your annual comprehensive exam unless otherwise stated and or with acute concerns.  It was a pleasure seeing you today! Please do not hesitate to reach out with any questions and or concerns.  Regards,   Iisha Soyars    

## 2022-11-03 NOTE — Assessment & Plan Note (Signed)
Patient Counseling(The following topics were reviewed): ? Preventative care handout given to pt  ?Health maintenance and immunizations reviewed. Please refer to Health maintenance section. ?Pt advised on safe sex, wearing seatbelts in car, and proper nutrition ?labwork ordered today for annual ?Dental health: Discussed importance of regular tooth brushing, flossing, and dental visits. ? ? ?

## 2022-11-03 NOTE — Assessment & Plan Note (Signed)
Continue qsymia, exercise and diet.  Continue with obgyn.

## 2022-11-03 NOTE — Progress Notes (Signed)
New Patient Office Visit  Subjective:  Patient ID: Jocelyn Griffin, female    DOB: [birth year]  Age: 32 y.o. MRN: [id number]  CC:  Chief Complaint  Patient presents with   Establish Care    HPI Jocelyn Griffin is here to establish care as a new patient.  Oriented to practice routines and expectations.  Prior provider was: has not had one in some time.  Also sees GYN Aloha Gell, MD   Pt is without acute concerns.   Pap: 2022 negative  Influenza: completed Hep c: declines   Exercise: 3-4 times a week , treadmill walks for about thirty minutes Diet: tries to focus on clean foods , making better food choices. Meal plans on Sundays. Tries to incorporate more vegetables.  Dental: regular cleanings Eye exam: once annually  Denies h/o STD   chronic concerns:  Epi pen : recently saw the allergist, and recommendation was to get allergy shots she has not yet started to get the shots yet.   overweight: doing well with qsymia 15-92 mg. Tolerating well. Gets from her OBGYN  Allergic rhinitis: xyzal montelukast and flonase and azelastine, seeing allergist currently.    ROS: Negative unless specifically indicated above in HPI.   Current Outpatient Medications:    albuterol (VENTOLIN HFA) 108 (90 Base) MCG/ACT inhaler, Inhale 1-2 puffs into the lungs every 6 (six) hours as needed (cough)., Disp: 6.7 g, Rfl: 0   azelastine (OPTIVAR) 0.05 % ophthalmic solution, Apply to eye., Disp: , Rfl:    EPINEPHrine 0.3 mg/0.3 mL IJ SOAJ injection, SMARTSIG:Injection IM As Directed, Disp: , Rfl:    fluticasone (FLONASE) 50 MCG/ACT nasal spray, Place 1 spray into both nostrils daily., Disp: , Rfl:    levocetirizine (XYZAL) 5 MG tablet, SMARTSIG:1 Tablet(s) By Mouth Every Evening, Disp: , Rfl:    montelukast (SINGULAIR) 10 MG tablet, SMARTSIG:1 Tablet(s) By Mouth Every Evening, Disp: , Rfl:    Phentermine-Topiramate (QSYMIA) 15-92 MG CP24, Take by mouth 1 day or 1 dose., Disp: , Rfl:  Past  Medical History:  Diagnosis Date   Asthma    last inhaler use a yr ago   Fetal arrhythmia affecting pregnancy, antepartum    documented PACS by cardiologist   Menorrhagia    Oligomenorrhea    Postcoital UTI    Past Surgical History:  Procedure Laterality Date   CESAREAN SECTION N/A 12/14/2014   Procedure: CESAREAN SECTION;  Surgeon: Aloha Gell, MD;  Location: [place] [facility];  Service: Obstetrics;  Laterality: N/A;   CESAREAN SECTION N/A 08/13/2017   Procedure: Repeat CESAREAN SECTION;  Surgeon: Aloha Gell, MD;  Location: [facility];  Service: Obstetrics;  Laterality: N/A;  EDD: 08/17/17 Time ok per Lenon Curt.   WISDOM TOOTH EXTRACTION  01/17/11    Objective:   Today's Vitals: BP 122/82   Pulse 92   Temp 97.7 F (36.5 C) (Temporal)   Ht 5' 4"$  (1.626 m)   Wt 168 lb 6.4 oz (76.4 kg)   SpO2 99%   BMI 28.91 kg/m   Physical Exam Vitals reviewed.  Constitutional:      General: She is not in acute distress.    Appearance: Normal appearance. She is normal weight. She is not ill-appearing.  HENT:     Head: Normocephalic.     Right Ear: Tympanic membrane normal.     Left Ear: Tympanic membrane normal.     Nose: Nose normal.     Mouth/Throat:     Mouth: Mucous membranes are moist.  Eyes:     Extraocular Movements: Extraocular movements intact.     Pupils: Pupils are equal, round, and reactive to light.  Cardiovascular:     Rate and Rhythm: Normal rate and regular rhythm.  Pulmonary:     Effort: Pulmonary effort is normal.     Breath sounds: Normal breath sounds.  Abdominal:     General: Abdomen is flat. Bowel sounds are normal.     Palpations: Abdomen is soft.     Tenderness: There is no guarding or rebound.  Musculoskeletal:        General: Normal range of motion.     Cervical back: Normal range of motion.  Skin:    General: Skin is warm.     Capillary Refill: Capillary refill takes less than 2 seconds.  Neurological:     General: No focal deficit present.      Mental Status: She is alert.  Psychiatric:        Mood and Affect: Mood normal.        Behavior: Behavior normal.        Thought Content: Thought content normal.        Judgment: Judgment normal.     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  Continue with obgyn.    Prediabetes Assessment & Plan: Pt advised of the following: Work on a diabetic diet, try to incorporate exercise at least 20-30 a day for 3 days a week or more.    Orders: -     Hemoglobin A1c  Mixed hyperlipidemia Assessment & Plan: Ordered lipid panel, pending results. Work on low cholesterol diet and exercise as tolerated   Orders: -     Lipid panel  Encounter for general adult medical examination without abnormal findings Assessment & Plan: Patient Counseling(The following topics were reviewed):  Preventative care handout given to pt  Health maintenance and immunizations reviewed. Please refer to Health maintenance section. Pt advised on safe sex, wearing seatbelts in car, and proper nutrition labwork ordered today for annual Dental health: Discussed importance of regular tooth brushing, flossing, and dental visits.   Orders: -     Lipid panel -     CBC -     Basic metabolic panel -     Hemoglobin A1c    Follow-up: Return in about 1 year (around 11/04/2023) for f/u CPE.   Eugenia Pancoast, FNP

## 2022-11-03 NOTE — Assessment & Plan Note (Signed)
Pt advised of the following: Work on a diabetic diet, try to incorporate exercise at least 20-30 a day for 3 days a week or more.

## 2022-11-03 NOTE — Assessment & Plan Note (Signed)
Albuterol prn  Improving

## 2022-12-05 ENCOUNTER — Ambulatory Visit: Payer: BC Managed Care – PPO | Admitting: Family

## 2022-12-05 ENCOUNTER — Encounter: Payer: Self-pay | Admitting: Family

## 2022-12-05 VITALS — BP 132/84 | HR 86 | Temp 97.9°F | Ht 64.0 in | Wt 168.0 lb

## 2022-12-05 DIAGNOSIS — J028 Acute pharyngitis due to other specified organisms: Secondary | ICD-10-CM

## 2022-12-05 DIAGNOSIS — J029 Acute pharyngitis, unspecified: Secondary | ICD-10-CM | POA: Diagnosis not present

## 2022-12-05 LAB — POCT RAPID STREP A (OFFICE): Rapid Strep A Screen: NEGATIVE

## 2022-12-05 MED ORDER — AMOXICILLIN 500 MG PO CAPS: 500.0000 mg | ORAL_CAPSULE | Freq: Two times a day (BID) | ORAL | 0 refills | Status: AC

## 2022-12-05 NOTE — Progress Notes (Signed)
Established Patient Office Visit  Subjective:   Patient ID: Jocelyn Griffin, female    DOB: Jun 03, 1991  Age: 32 y.o. MRN: [id number]  CC:  Chief Complaint  Patient presents with   Sore Throat    HPI: Jocelyn Griffin is a 32 y.o. female presenting today for sore throat.  Reports she started having symptoms 7 days ago. Symptoms include lethargy, and sore throat. Has been taking OTC the Advil and Tylenol for pain with no relief.  She also been taking her allergy medicine as prescribed. Denies sinus pressure, cough, congestion, headache, chills, fever, nausea, vomiting, diarrhea. Reports both of her kids has been sick.    ROS: Negative unless specifically indicated above in HPI.   Relevant past medical history reviewed and updated as indicated.   Allergies and medications reviewed and updated.   Current Outpatient Medications:    albuterol (VENTOLIN HFA) 108 (90 Base) MCG/ACT inhaler, Inhale 1-2 puffs into the lungs every 6 (six) hours as needed (cough)., Disp: 6.7 g, Rfl: 0   amoxicillin (AMOXIL) 500 MG capsule, Take 1 capsule (500 mg total) by mouth 2 (two) times daily for 10 days., Disp: 20 capsule, Rfl: 0   azelastine (OPTIVAR) 0.05 % ophthalmic solution, Apply to eye., Disp: , Rfl:    EPINEPHrine 0.3 mg/0.3 mL IJ SOAJ injection, SMARTSIG:Injection IM As Directed, Disp: , Rfl:    fluticasone (FLONASE) 50 MCG/ACT nasal spray, Place 1 spray into both nostrils daily., Disp: , Rfl:    levocetirizine (XYZAL) 5 MG tablet, SMARTSIG:1 Tablet(s) By Mouth Every Evening, Disp: , Rfl:    montelukast (SINGULAIR) 10 MG tablet, SMARTSIG:1 Tablet(s) By Mouth Every Evening, Disp: , Rfl:    Phentermine-Topiramate (QSYMIA) 15-92 MG CP24, Take by mouth 1 day or 1 dose., Disp: , Rfl:   No Known Allergies  Objective:   BP 132/84   Pulse 86   Temp 97.9 F (36.6 C) (Temporal)   Ht 5\' 4"  (1.626 m)   Wt 168 lb (76.2 kg)   SpO2 99%   BMI 28.84 kg/m    Physical Exam Vitals and nursing note  reviewed.  Constitutional:      Appearance: Normal appearance.  HENT:     Head: Normocephalic.     Right Ear: Hearing, tympanic membrane, ear canal and external ear normal.     Left Ear: Hearing, tympanic membrane, ear canal and external ear normal.     Nose: Nose normal.     Mouth/Throat:     Mouth: Mucous membranes are dry.     Pharynx: Pharyngeal swelling and posterior oropharyngeal erythema present.     Tonsils: No tonsillar exudate or tonsillar abscesses. 1+ on the right. 1+ on the left.  Eyes:     Extraocular Movements: Extraocular movements intact.     Conjunctiva/sclera: Conjunctivae normal.     Pupils: Pupils are equal, round, and reactive to light.  Cardiovascular:     Rate and Rhythm: Normal rate and regular rhythm.     Pulses: Normal pulses.     Heart sounds: Normal heart sounds.  Pulmonary:     Effort: Pulmonary effort is normal.     Breath sounds: Normal breath sounds.  Lymphadenopathy:     Cervical: Cervical adenopathy present.     Right cervical: Superficial cervical adenopathy present.     Left cervical: Superficial cervical adenopathy present.  Skin:    General: Skin is warm and dry.  Neurological:     General: No focal deficit present.  Mental Status: She is alert and oriented to person, place, and time.  Psychiatric:        Mood and Affect: Mood normal.        Behavior: Behavior normal.        Thought Content: Thought content normal.        Judgment: Judgment normal.     Assessment & Plan:  Sore throat -     POCT rapid strep A  Pharyngitis due to other organism Assessment & Plan: Strep negative however with cervical lymphadenopathy, tonsils enlarged slightly will choose to treat with Amoxicllin. Rx sent to patient's pharmacy for Amoxicillin 500 mg twice daily for 10 days. Advised patient to take OTC Ibuprofen for pain.   I evaluated the patient,  was consulted regarding plans for treatment of care, and agree with the assessment and plan per  Joya Gaskins, RN, DNP student.  Eugenia Pancoast, FNP-C     Orders: -     Amoxicillin; Take 1 capsule (500 mg total) by mouth 2 (two) times daily for 10 days.  Dispense: 20 capsule; Refill: 0     Follow up plan: Return in 1 year (on 12/05/2023), or if symptoms worsen or fail to improve, for f/u CPE after 2/25.  Eugenia Pancoast, FNP AGNP-student

## 2022-12-05 NOTE — Progress Notes (Signed)
Established Patient Office Visit  Subjective:   Patient ID: Jocelyn Griffin, female    DOB: 07/04/1991  Age: 32 y.o. MRN: [id number]  CC:  Chief Complaint  Patient presents with   Sore Throat    HPI: NATUSHA Griffin is a 32 y.o. female presenting on 12/05/2022 for Sore Throat     Sore Throat     7 day history of sore throat, that is keeping her up at night due to the pian.  Her kids both had pink eye last week with congestion.  Pt today states no nasal congestion, sinus pressure, cough or chest congestion.  No fever or chills.  Some mild bil ear fullness.         ROS: Negative unless specifically indicated above in HPI.   Relevant past medical history reviewed and updated as indicated.   Allergies and medications reviewed and updated.   Current Outpatient Medications:    albuterol (VENTOLIN HFA) 108 (90 Base) MCG/ACT inhaler, Inhale 1-2 puffs into the lungs every 6 (six) hours as needed (cough)., Disp: 6.7 g, Rfl: 0   amoxicillin (AMOXIL) 500 MG capsule, Take 1 capsule (500 mg total) by mouth 2 (two) times daily for 10 days., Disp: 20 capsule, Rfl: 0   azelastine (OPTIVAR) 0.05 % ophthalmic solution, Apply to eye., Disp: , Rfl:    EPINEPHrine 0.3 mg/0.3 mL IJ SOAJ injection, SMARTSIG:Injection IM As Directed, Disp: , Rfl:    fluticasone (FLONASE) 50 MCG/ACT nasal spray, Place 1 spray into both nostrils daily., Disp: , Rfl:    levocetirizine (XYZAL) 5 MG tablet, SMARTSIG:1 Tablet(s) By Mouth Every Evening, Disp: , Rfl:    montelukast (SINGULAIR) 10 MG tablet, SMARTSIG:1 Tablet(s) By Mouth Every Evening, Disp: , Rfl:    Phentermine-Topiramate (QSYMIA) 15-92 MG CP24, Take by mouth 1 day or 1 dose., Disp: , Rfl:   No Known Allergies  Objective:   BP 132/84   Pulse 86   Temp 97.9 F (36.6 C) (Temporal)   Ht 5\' 4"  (1.626 m)   Wt 168 lb (76.2 kg)   SpO2 99%   BMI 28.84 kg/m    Physical Exam Constitutional:      General: She is not in acute distress.     Appearance: Normal appearance. She is normal weight. She is not ill-appearing, toxic-appearing or diaphoretic.  HENT:     Head: Normocephalic.     Right Ear: Tympanic membrane normal.     Left Ear: Tympanic membrane normal.     Nose: Nose normal.     Mouth/Throat:     Mouth: Mucous membranes are dry.     Pharynx: Posterior oropharyngeal erythema present. No oropharyngeal exudate.     Tonsils: No tonsillar exudate or tonsillar abscesses. 1+ on the right. 1+ on the left.  Eyes:     Extraocular Movements: Extraocular movements intact.     Pupils: Pupils are equal, round, and reactive to light.  Cardiovascular:     Rate and Rhythm: Normal rate and regular rhythm.     Pulses: Normal pulses.     Heart sounds: Normal heart sounds.  Pulmonary:     Effort: Pulmonary effort is normal.     Breath sounds: Normal breath sounds.  Musculoskeletal:     Cervical back: Normal range of motion.  Lymphadenopathy:     Cervical: Cervical adenopathy present.     Right cervical: Superficial cervical adenopathy present.     Left cervical: Superficial cervical adenopathy present.  Neurological:  General: No focal deficit present.     Mental Status: She is alert and oriented to person, place, and time. Mental status is at baseline.  Psychiatric:        Mood and Affect: Mood normal.        Behavior: Behavior normal.        Thought Content: Thought content normal.        Judgment: Judgment normal.     Assessment & Plan:  Sore throat -     POCT rapid strep A  Pharyngitis due to other organism Assessment & Plan: Strep negative however with cervical lymphadenopathy, tonsils enlarged slightly will choose to treat with Amoxicllin. Rx sent to patient's pharmacy for Amoxicillin 500 mg twice daily for 10 days. Advised patient to take OTC Ibuprofen for pain.   I evaluated the patient,  was consulted regarding plans for treatment of care, and agree with the assessment and plan per Joya Gaskins, RN, DNP  student.  Eugenia Pancoast, FNP-C     Orders: -     Amoxicillin; Take 1 capsule (500 mg total) by mouth 2 (two) times daily for 10 days.  Dispense: 20 capsule; Refill: 0     Follow up plan: Return in 1 year (on 12/05/2023), or if symptoms worsen or fail to improve, for f/u CPE after 2/25.  Eugenia Pancoast, FNP

## 2022-12-05 NOTE — Assessment & Plan Note (Addendum)
Strep negative however with cervical lymphadenopathy, tonsils enlarged slightly will choose to treat with Amoxicllin. Rx sent to patient's pharmacy for Amoxicillin 500 mg twice daily for 10 days. Advised patient to take OTC Ibuprofen for pain.   I evaluated the patient,  was consulted regarding plans for treatment of care, and agree with the assessment and plan per Joya Gaskins, RN, DNP student.  -Eugenia Pancoast, FNP-C

## 2023-01-02 DIAGNOSIS — J3081 Allergic rhinitis due to animal (cat) (dog) hair and dander: Secondary | ICD-10-CM | POA: Diagnosis not present

## 2023-01-02 DIAGNOSIS — J3089 Other allergic rhinitis: Secondary | ICD-10-CM | POA: Diagnosis not present

## 2023-01-02 DIAGNOSIS — J4530 Mild persistent asthma, uncomplicated: Secondary | ICD-10-CM | POA: Diagnosis not present

## 2023-01-02 DIAGNOSIS — H1045 Other chronic allergic conjunctivitis: Secondary | ICD-10-CM | POA: Diagnosis not present

## 2023-01-16 DIAGNOSIS — Z713 Dietary counseling and surveillance: Secondary | ICD-10-CM | POA: Diagnosis not present

## 2023-03-29 DIAGNOSIS — F4320 Adjustment disorder, unspecified: Secondary | ICD-10-CM | POA: Diagnosis not present

## 2023-04-12 DIAGNOSIS — F4320 Adjustment disorder, unspecified: Secondary | ICD-10-CM | POA: Diagnosis not present

## 2023-04-27 DIAGNOSIS — F4320 Adjustment disorder, unspecified: Secondary | ICD-10-CM | POA: Diagnosis not present

## 2023-05-02 DIAGNOSIS — Z8639 Personal history of other endocrine, nutritional and metabolic disease: Secondary | ICD-10-CM | POA: Diagnosis not present

## 2023-05-02 DIAGNOSIS — Z6827 Body mass index (BMI) 27.0-27.9, adult: Secondary | ICD-10-CM | POA: Diagnosis not present

## 2023-05-02 DIAGNOSIS — Z713 Dietary counseling and surveillance: Secondary | ICD-10-CM | POA: Diagnosis not present

## 2023-05-23 DIAGNOSIS — F4320 Adjustment disorder, unspecified: Secondary | ICD-10-CM | POA: Diagnosis not present

## 2023-06-20 DIAGNOSIS — F4320 Adjustment disorder, unspecified: Secondary | ICD-10-CM | POA: Diagnosis not present

## 2023-07-05 DIAGNOSIS — J4530 Mild persistent asthma, uncomplicated: Secondary | ICD-10-CM | POA: Diagnosis not present

## 2023-07-05 DIAGNOSIS — H1045 Other chronic allergic conjunctivitis: Secondary | ICD-10-CM | POA: Diagnosis not present

## 2023-07-05 DIAGNOSIS — J3081 Allergic rhinitis due to animal (cat) (dog) hair and dander: Secondary | ICD-10-CM | POA: Diagnosis not present

## 2023-07-05 DIAGNOSIS — J3089 Other allergic rhinitis: Secondary | ICD-10-CM | POA: Diagnosis not present

## 2023-07-10 DIAGNOSIS — F4320 Adjustment disorder, unspecified: Secondary | ICD-10-CM | POA: Diagnosis not present

## 2023-11-01 DIAGNOSIS — Z713 Dietary counseling and surveillance: Secondary | ICD-10-CM | POA: Diagnosis not present

## 2023-11-01 DIAGNOSIS — Z1331 Encounter for screening for depression: Secondary | ICD-10-CM | POA: Diagnosis not present

## 2023-11-01 DIAGNOSIS — Z01411 Encounter for gynecological examination (general) (routine) with abnormal findings: Secondary | ICD-10-CM | POA: Diagnosis not present

## 2023-11-01 DIAGNOSIS — Z124 Encounter for screening for malignant neoplasm of cervix: Secondary | ICD-10-CM | POA: Diagnosis not present

## 2023-11-01 DIAGNOSIS — Z0142 Encounter for cervical smear to confirm findings of recent normal smear following initial abnormal smear: Secondary | ICD-10-CM | POA: Diagnosis not present

## 2023-11-01 DIAGNOSIS — Z01419 Encounter for gynecological examination (general) (routine) without abnormal findings: Secondary | ICD-10-CM | POA: Diagnosis not present

## 2023-12-03 ENCOUNTER — Encounter: Payer: Self-pay | Admitting: Oncology

## 2023-12-03 ENCOUNTER — Ambulatory Visit (INDEPENDENT_AMBULATORY_CARE_PROVIDER_SITE_OTHER): Payer: Self-pay | Admitting: Oncology

## 2023-12-03 ENCOUNTER — Other Ambulatory Visit: Payer: Self-pay

## 2023-12-03 VITALS — BP 129/92 | HR 76 | Temp 98.6°F | Ht 64.0 in | Wt 169.0 lb

## 2023-12-03 DIAGNOSIS — M7989 Other specified soft tissue disorders: Secondary | ICD-10-CM

## 2023-12-03 DIAGNOSIS — S6992XA Unspecified injury of left wrist, hand and finger(s), initial encounter: Secondary | ICD-10-CM

## 2023-12-03 NOTE — Progress Notes (Unsigned)
 [facility]  [address] [place], [place] [address] Phone: [phone] Fax: [phone]   Office Visit Note  Patient Name: Jocelyn Griffin  Date of Birth:03-04-1991  Med Rec number [id number]  Date of Service: 12/04/2023  Patient has no known allergies.  Chief Complaint  Patient presents with   Acute Visit    Patient c/o swelling and discomfort below the knuckle of her L ring finger. She is unable to fit her rings on it. Symptoms began 5 days ago. No known injury. She took Advil which was somewhat helpful.    HPI Patient is an 33 y.o. female with complaints of left ring finger swelling that started last Wednesday.  Symptoms have improved since she has taken ibuprofen.  Denies any obvious injury.  She took off her wedding band because it was too tight.  Reports it is only swollen from the MIP to PIP joint of left ring finger.  Full range of motion although some tenderness to MIP.  There is no abrasion or skin tear. No fevers.  No erythema.  Current Medication:  Outpatient Encounter Medications as of 12/03/2023  Medication Sig   albuterol (VENTOLIN HFA) 108 (90 Base) MCG/ACT inhaler Inhale 1-2 puffs into the lungs every 6 (six) hours as needed (cough).   azelastine (OPTIVAR) 0.05 % ophthalmic solution Apply to eye as needed.   EPINEPHrine 0.3 mg/0.3 mL IJ SOAJ injection SMARTSIG:Injection IM As Directed   fluticasone (FLONASE) 50 MCG/ACT nasal spray Place 1 spray into both nostrils daily.   levocetirizine (XYZAL) 5 MG tablet SMARTSIG:1 Tablet(s) By Mouth Every Evening   montelukast (SINGULAIR) 10 MG tablet SMARTSIG:1 Tablet(s) By Mouth Every Evening   Phentermine-Topiramate (QSYMIA) 15-92 MG CP24 Take by mouth 1 day or 1 dose.   No facility-administered encounter medications on file as of 12/03/2023.      Medical History: Past Medical History:  Diagnosis Date   Asthma    last inhaler use a yr ago   Fetal arrhythmia affecting pregnancy, antepartum    documented  PACS by cardiologist   Menorrhagia    Oligomenorrhea    Postcoital UTI    Vital Signs: BP (!) 129/92   Pulse 76   Temp 98.6 F (37 C)   Ht 5\' 4"  (1.626 m)   Wt 169 lb (76.7 kg)   SpO2 99%   BMI 29.01 kg/m   ROS: As per HPI.  All other pertinent ROS negative.     Review of Systems  Musculoskeletal:  Positive for arthralgias (Left ring finger).  Skin:  Negative for color change, rash and wound.    Physical Exam Constitutional:      Appearance: Normal appearance.  Musculoskeletal:     Left hand: Swelling and bony tenderness present. No deformity. Normal range of motion.     Comments: Bony tenderness to left rig finger MIP joint but no obvious deformity. No tenderness to PIP or DIP. Full ROM.   Neurological:     Mental Status: She is alert.     No results found for this or any previous visit (from the past 24 hours).  Assessment/Plan: 1. Injury of left ring finger, initial encounter (Primary) We discussed she likely sustained a sprain/strain from an unknown obvious injury last week.  There is mild swelling from left ring finger MIP to PIP.  No obvious swelling at DIP.  No erythema or ecchymosis.  Full range of motion.  I do not think we need to splint her finger at this  time.  There is only tenderness with palpation otherwise, she is pain-free.  Recommend she continue ibuprofen 800 mg twice daily and ice when able.  If no improvement over the next 5 to 7 days, please let me know.   General Counseling: loredana medellin understanding of the findings of todays visit and agrees with plan of treatment. I have discussed any further diagnostic evaluation that may be needed or ordered today. We also reviewed her medications today. she has been encouraged to call the office with any questions or concerns that should arise related to todays visit.   No orders of the defined types were placed in this encounter.   No orders of the defined types were placed in this encounter.   I  spent 20 minutes dedicated to the care of this patient (face-to-face and non-face-to-face) on the date of the encounter to include what is described in the assessment and plan.   Durenda Hurt, NP 12/04/2023 8:44 AM

## 2024-01-29 DIAGNOSIS — Z713 Dietary counseling and surveillance: Secondary | ICD-10-CM | POA: Diagnosis not present

## 2024-07-01 DIAGNOSIS — J3089 Other allergic rhinitis: Secondary | ICD-10-CM | POA: Diagnosis not present

## 2024-07-01 DIAGNOSIS — J3081 Allergic rhinitis due to animal (cat) (dog) hair and dander: Secondary | ICD-10-CM | POA: Diagnosis not present

## 2024-07-01 DIAGNOSIS — J4530 Mild persistent asthma, uncomplicated: Secondary | ICD-10-CM | POA: Diagnosis not present

## 2024-07-01 DIAGNOSIS — H1045 Other chronic allergic conjunctivitis: Secondary | ICD-10-CM | POA: Diagnosis not present

## 2024-07-15 DIAGNOSIS — Z713 Dietary counseling and surveillance: Secondary | ICD-10-CM | POA: Diagnosis not present

## 2024-07-15 DIAGNOSIS — Z6827 Body mass index (BMI) 27.0-27.9, adult: Secondary | ICD-10-CM | POA: Diagnosis not present

## 2024-07-15 DIAGNOSIS — Z8639 Personal history of other endocrine, nutritional and metabolic disease: Secondary | ICD-10-CM | POA: Diagnosis not present

## 2024-08-04 ENCOUNTER — Ambulatory Visit: Payer: Self-pay | Admitting: Medical

## 2024-08-04 ENCOUNTER — Encounter: Payer: Self-pay | Admitting: Medical

## 2024-08-04 ENCOUNTER — Other Ambulatory Visit: Payer: Self-pay

## 2024-08-04 VITALS — BP 120/82 | HR 70 | Temp 98.2°F | Ht 64.0 in | Wt 165.0 lb

## 2024-08-04 DIAGNOSIS — J3489 Other specified disorders of nose and nasal sinuses: Secondary | ICD-10-CM

## 2024-08-04 DIAGNOSIS — J069 Acute upper respiratory infection, unspecified: Secondary | ICD-10-CM

## 2024-08-04 NOTE — Progress Notes (Signed)
 [facility] [address] [place], [place] [address]   Office Visit Note  Patient Name: Jocelyn Griffin Date of Birth [id number]  Medical Record number [id number]  Date of Service: 08/04/2024  Chief Complaint  Patient presents with   Acute Visit    Patient c/o R ear pain and nasal congestion since Friday. She has been taking Dayquil and Nyquil. She has not used her Flonase.     HPI 33 y.o. female presents with respiratory symptoms.  Sx began 3-4 days ago with sore throat. Head/nasal congestion began 2-3 days ago. No fever or chills. Noted right ear pain which came on suddenly overnight last night. Minimal dry cough. Some yellow nasal d/c but does not seem to alleviate congestion when she blows it. Some rhinorrhea. Also noted frontal sinus pressure/headache last night. Some myalgia 2 days ago. Has been fatigued. No nausea, vomiting or diarrhea.   No known sick contacts.   Has taken Dayquil and Nyquil, little helpful during day.   Has seasonal allergies, usually in fall. Takes Xyzal and Singulair daily. Has not used Flonase recently. Has an albuterol  inhaler for use with allergy-associated sx. Sometimes needs this when sick as well.  Some hx of sinus infections before becoming aware of and treating her allergies.    Current Medication:  Outpatient Encounter Medications as of 08/04/2024  Medication Sig   albuterol  (VENTOLIN  HFA) 108 (90 Base) MCG/ACT inhaler Inhale 1-2 puffs into the lungs every 6 (six) hours as needed (cough).   EPINEPHrine 0.3 mg/0.3 mL IJ SOAJ injection SMARTSIG:Injection IM As Directed   fluticasone (FLONASE) 50 MCG/ACT nasal spray Place 1 spray into both nostrils daily. (Patient taking differently: Place 1 spray into both nostrils daily as needed.)   levocetirizine (XYZAL) 5 MG tablet SMARTSIG:1 Tablet(s) By Mouth Every Evening   montelukast (SINGULAIR) 10 MG tablet SMARTSIG:1 Tablet(s) By Mouth Every Evening   Phentermine-Topiramate  (QSYMIA) 15-92 MG CP24 Take by mouth 1 day or 1 dose.   [DISCONTINUED] azelastine (OPTIVAR) 0.05 % ophthalmic solution Apply to eye as needed.   No facility-administered encounter medications on file as of 08/04/2024.      Medical History: Past Medical History:  Diagnosis Date   Asthma    last inhaler use a yr ago   Fetal arrhythmia affecting pregnancy, antepartum    documented PACS by cardiologist   Menorrhagia    Oligomenorrhea    Postcoital UTI      Vital Signs: BP 120/82   Pulse 70   Temp 98.2 F (36.8 C)   Ht 5' 4 (1.626 m)   Wt 165 lb (74.8 kg)   SpO2 100%   BMI 28.32 kg/m    Review of Systems See HPI  Physical Exam Vitals reviewed.  Constitutional:      General: She is not in acute distress.    Appearance: She is not ill-appearing.     Comments: Tired appearing  HENT:     Head: Normocephalic.     Right Ear: Ear canal and external ear normal.     Left Ear: Ear canal and external ear normal.     Ears:     Comments: TMs dull bilaterally. Perhaps small serous middle ear fluid. Small thick cerumen to left EAC, removed easily with curette.    Nose: Mucosal edema and congestion present. No rhinorrhea.     Right Sinus: Maxillary sinus tenderness (slight) present.     Comments: Some pressure with palpation over maxillary sinuses.  Mouth/Throat:     Mouth: Mucous membranes are moist. No oral lesions.     Pharynx: Posterior oropharyngeal erythema present. No pharyngeal swelling.     Tonsils: No tonsillar exudate. 0 on the right. 0 on the left.  Cardiovascular:     Rate and Rhythm: Normal rate and regular rhythm.     Heart sounds: No murmur heard.    No friction rub. No gallop.  Pulmonary:     Effort: Pulmonary effort is normal.     Breath sounds: Normal breath sounds. No wheezing, rhonchi or rales.  Musculoskeletal:     Cervical back: Neck supple. No rigidity.  Lymphadenopathy:     Cervical: Cervical adenopathy (small anterior nodes, mildly tender)  present.  Neurological:     Mental Status: She is alert.     Assessment/Plan: 1. Acute upper respiratory infection (Primary) 2. Sinus pressure Suspect viral infection. Discussed trial of Sudafed/Pseudoephedrine, Ibuprofen  and Flonase during day. May continue Nyquil at bedtime. Discussed supportive measures. Advised to message provider or schedule follow up visit with new/worsening symptoms.   Patient Instructions  -Rest and stay well hydrated (by drinking water and other liquids).  -Take over-the-counter medicines (i.e. Sudafed/Pseudoephedrine, Nyquil, Ibuprofen ) to help relieve your symptoms. -Try Flonase/Fluticasone nasal spray, 2 sprays to each nostril once a day. -For sore throat/cough, use cough drops/throat lozenges, gargle warm salt water and/or drink warm liquids (like tea with honey). -Send MyChart message to provider or schedule return visit as needed for new/worsening symptoms (i.e. increased or persistent ear/sinus pain, fever) or if symptoms do not improve as discussed with recommended treatment over next 5-7 days.      General Counseling: teagyn fishel understanding of the findings of todays visit and plan of treatment. she has been encouraged to call the office with any questions or concerns that should arise related to todays visit.    Time spent:20 Minutes    Joen Arts PA-C Physician Assistant

## 2024-08-04 NOTE — Patient Instructions (Signed)
-  Rest and stay well hydrated (by drinking water and other liquids).  -Take over-the-counter medicines (i.e. Sudafed/Pseudoephedrine, Nyquil, Ibuprofen ) to help relieve your symptoms. -Try Flonase/Fluticasone nasal spray, 2 sprays to each nostril once a day. -For sore throat/cough, use cough drops/throat lozenges, gargle warm salt water and/or drink warm liquids (like tea with honey). -Send MyChart message to provider or schedule return visit as needed for new/worsening symptoms (i.e. increased or persistent ear/sinus pain, fever) or if symptoms do not improve as discussed with recommended treatment over next 5-7 days.
